# Patient Record
Sex: Female | Born: 1975
Health system: Southern US, Community
[De-identification: ages and names within clinical notes are randomized; demographics above are authoritative.]

## PROBLEM LIST (undated history)

## (undated) ENCOUNTER — Inpatient Hospital Stay (HOSPITAL_COMMUNITY): Payer: Self-pay

## (undated) DIAGNOSIS — J189 Pneumonia, unspecified organism: Secondary | ICD-10-CM

## (undated) DIAGNOSIS — R112 Nausea with vomiting, unspecified: Secondary | ICD-10-CM

## (undated) DIAGNOSIS — N189 Chronic kidney disease, unspecified: Secondary | ICD-10-CM

## (undated) DIAGNOSIS — F419 Anxiety disorder, unspecified: Secondary | ICD-10-CM

## (undated) DIAGNOSIS — C801 Malignant (primary) neoplasm, unspecified: Secondary | ICD-10-CM

## (undated) DIAGNOSIS — E282 Polycystic ovarian syndrome: Secondary | ICD-10-CM

## (undated) DIAGNOSIS — C8590 Non-Hodgkin lymphoma, unspecified, unspecified site: Secondary | ICD-10-CM

## (undated) DIAGNOSIS — Z87442 Personal history of urinary calculi: Secondary | ICD-10-CM

## (undated) DIAGNOSIS — Z9889 Other specified postprocedural states: Secondary | ICD-10-CM

## (undated) HISTORY — PX: TONSILLECTOMY AND ADENOIDECTOMY: SUR1326

## (undated) HISTORY — PX: OTHER SURGICAL HISTORY: SHX169

## (undated) HISTORY — DX: Chronic kidney disease, unspecified: N18.9

## (undated) HISTORY — DX: Pneumonia, unspecified organism: J18.9

## (undated) HISTORY — PX: ROTATOR CUFF REPAIR: SHX139

## (undated) HISTORY — PX: MYRINGOTOMY: SUR874

## (undated) MED FILL — Ceftriaxone Sodium For Inj 2 GM: INTRAMUSCULAR | Qty: 20 | Status: AC

---

## 2003-12-02 ENCOUNTER — Other Ambulatory Visit: Admission: RE | Admit: 2003-12-02 | Discharge: 2003-12-02 | Payer: Self-pay | Admitting: Obstetrics and Gynecology

## 2004-12-12 ENCOUNTER — Other Ambulatory Visit: Admission: RE | Admit: 2004-12-12 | Discharge: 2004-12-12 | Payer: Self-pay | Admitting: Obstetrics and Gynecology

## 2005-12-18 ENCOUNTER — Other Ambulatory Visit: Admission: RE | Admit: 2005-12-18 | Discharge: 2005-12-18 | Payer: Self-pay | Admitting: Obstetrics and Gynecology

## 2007-10-18 ENCOUNTER — Encounter: Admission: RE | Admit: 2007-10-18 | Discharge: 2007-10-18 | Payer: Self-pay | Admitting: Family Medicine

## 2007-10-31 ENCOUNTER — Ambulatory Visit: Payer: Self-pay | Admitting: Cardiology

## 2007-10-31 ENCOUNTER — Ambulatory Visit: Payer: Self-pay | Admitting: Internal Medicine

## 2007-10-31 DIAGNOSIS — R071 Chest pain on breathing: Secondary | ICD-10-CM | POA: Insufficient documentation

## 2007-10-31 DIAGNOSIS — R259 Unspecified abnormal involuntary movements: Secondary | ICD-10-CM | POA: Insufficient documentation

## 2007-10-31 DIAGNOSIS — R05 Cough: Secondary | ICD-10-CM

## 2007-10-31 DIAGNOSIS — R059 Cough, unspecified: Secondary | ICD-10-CM | POA: Insufficient documentation

## 2007-10-31 LAB — CONVERTED CEMR LAB
Basophils Relative: 1 % (ref 0.0–1.0)
CO2: 27 meq/L (ref 19–32)
Chloride: 99 meq/L (ref 96–112)
Eosinophils Relative: 0.6 % (ref 0.0–5.0)
GFR calc Af Amer: 125 mL/min
GFR calc non Af Amer: 103 mL/min
Glucose, Bld: 87 mg/dL (ref 70–99)
MCHC: 33.6 g/dL (ref 30.0–36.0)
Monocytes Absolute: 0.6 10*3/uL (ref 0.2–0.7)
Neutrophils Relative %: 70.3 % (ref 43.0–77.0)
Platelets: 240 10*3/uL (ref 150–400)
Potassium: 3.7 meq/L (ref 3.5–5.1)
RBC: 4.44 M/uL (ref 3.87–5.11)
RDW: 12.8 % (ref 11.5–14.6)
WBC: 10.1 10*3/uL (ref 4.5–10.5)

## 2007-11-01 ENCOUNTER — Ambulatory Visit: Admission: RE | Admit: 2007-11-01 | Discharge: 2007-11-01 | Payer: Self-pay | Admitting: Obstetrics and Gynecology

## 2007-11-01 ENCOUNTER — Encounter: Payer: Self-pay | Admitting: Internal Medicine

## 2007-11-01 ENCOUNTER — Telehealth: Payer: Self-pay | Admitting: Internal Medicine

## 2007-11-18 ENCOUNTER — Ambulatory Visit: Payer: Self-pay | Admitting: Internal Medicine

## 2008-09-22 ENCOUNTER — Ambulatory Visit: Payer: Self-pay | Admitting: Internal Medicine

## 2008-09-22 ENCOUNTER — Ambulatory Visit (HOSPITAL_COMMUNITY): Admission: RE | Admit: 2008-09-22 | Discharge: 2008-09-22 | Payer: Self-pay | Admitting: Internal Medicine

## 2008-09-22 DIAGNOSIS — R091 Pleurisy: Secondary | ICD-10-CM | POA: Insufficient documentation

## 2008-09-29 ENCOUNTER — Telehealth (INDEPENDENT_AMBULATORY_CARE_PROVIDER_SITE_OTHER): Payer: Self-pay | Admitting: *Deleted

## 2008-10-01 ENCOUNTER — Ambulatory Visit: Payer: Self-pay | Admitting: Internal Medicine

## 2008-10-01 DIAGNOSIS — J189 Pneumonia, unspecified organism: Secondary | ICD-10-CM

## 2008-10-13 ENCOUNTER — Ambulatory Visit: Payer: Self-pay | Admitting: Internal Medicine

## 2008-10-13 DIAGNOSIS — J328 Other chronic sinusitis: Secondary | ICD-10-CM | POA: Insufficient documentation

## 2008-10-13 DIAGNOSIS — J019 Acute sinusitis, unspecified: Secondary | ICD-10-CM | POA: Insufficient documentation

## 2008-10-15 ENCOUNTER — Ambulatory Visit: Payer: Self-pay | Admitting: Cardiovascular Disease

## 2008-10-20 ENCOUNTER — Telehealth: Payer: Self-pay | Admitting: Internal Medicine

## 2008-10-21 ENCOUNTER — Telehealth: Payer: Self-pay | Admitting: Internal Medicine

## 2008-11-10 ENCOUNTER — Encounter: Payer: Self-pay | Admitting: Internal Medicine

## 2008-11-16 ENCOUNTER — Encounter: Payer: Self-pay | Admitting: Internal Medicine

## 2009-10-09 HISTORY — PX: DILATION AND CURETTAGE OF UTERUS: SHX78

## 2009-10-11 ENCOUNTER — Encounter: Admission: RE | Admit: 2009-10-11 | Discharge: 2009-10-11 | Payer: Self-pay | Admitting: Obstetrics and Gynecology

## 2009-11-14 ENCOUNTER — Emergency Department (HOSPITAL_COMMUNITY): Admission: EM | Admit: 2009-11-14 | Discharge: 2009-11-14 | Payer: Self-pay | Admitting: Emergency Medicine

## 2010-02-04 ENCOUNTER — Ambulatory Visit (HOSPITAL_COMMUNITY): Admission: RE | Admit: 2010-02-04 | Discharge: 2010-02-04 | Payer: Self-pay | Admitting: Obstetrics and Gynecology

## 2010-02-06 DEATH — deceased

## 2010-06-08 IMAGING — CT CT ABD-PELV W/ CM
2 of 4 series · 17 of 46 positions shown, 19 images · IV contrast (APPLIED)
Comparison: None.

CLINICAL DATA: Abdominal pain

CT ABDOMEN AND PELVIS WITH CONTRAST
TECHNIQUE: Multidetector CT imaging of the abdomen and pelvis was
performed following the standard protocol during bolus
administration of intravenous contrast.
Contrast: 100 ML OMNI

[Series 2: abd/pelv with 5.0 b31f st · axial · 0.62mm/px · z∈[-438,-33]mm · 14 of 89 slices shown, 16 images]
[im 4/89  soft-tissue]
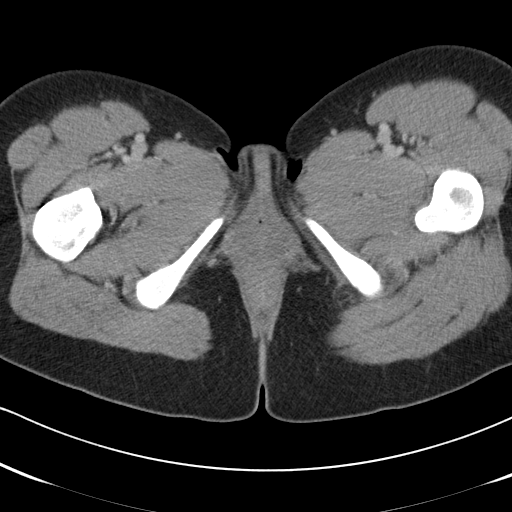
[im 4/89  bone]
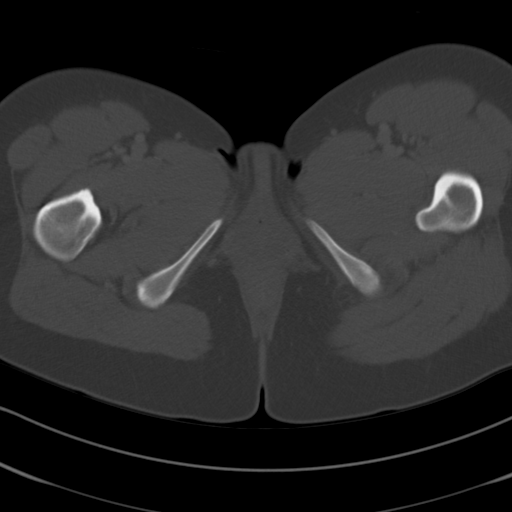
[im 11/89  soft-tissue]
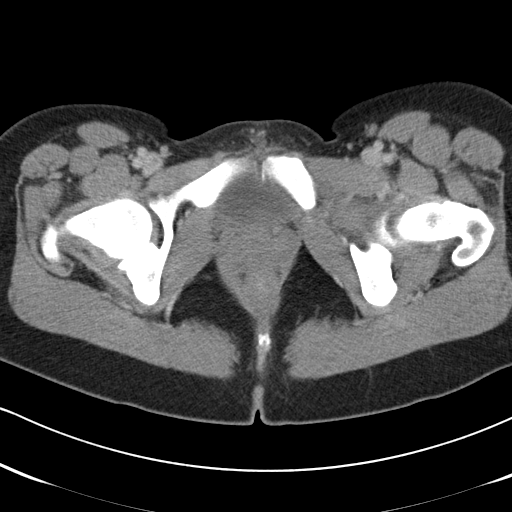
[im 18/89  soft-tissue]
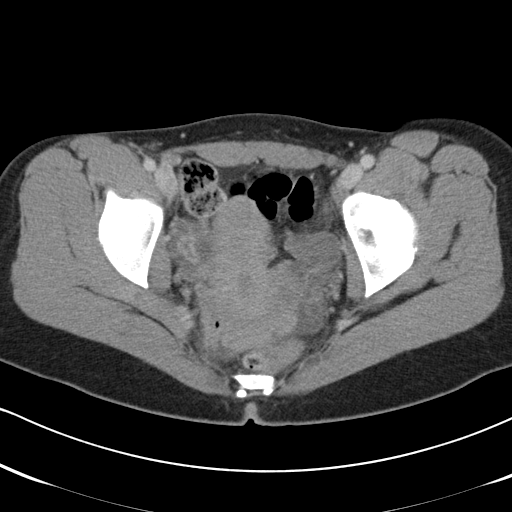
[im 25/89  soft-tissue]
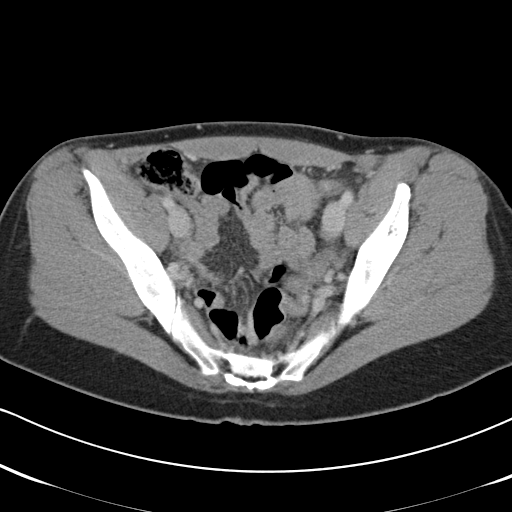
[im 29/89  soft-tissue]
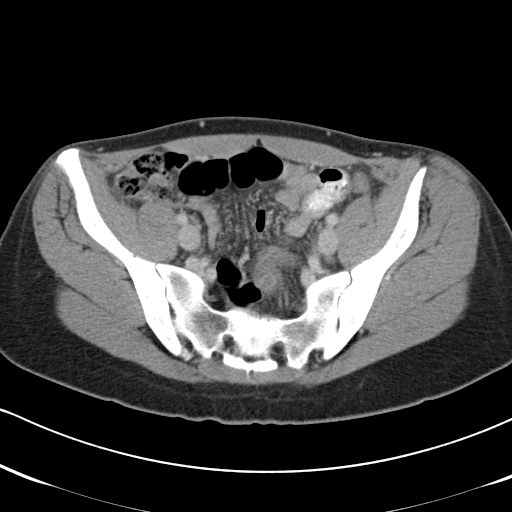
[im 36/89  soft-tissue]
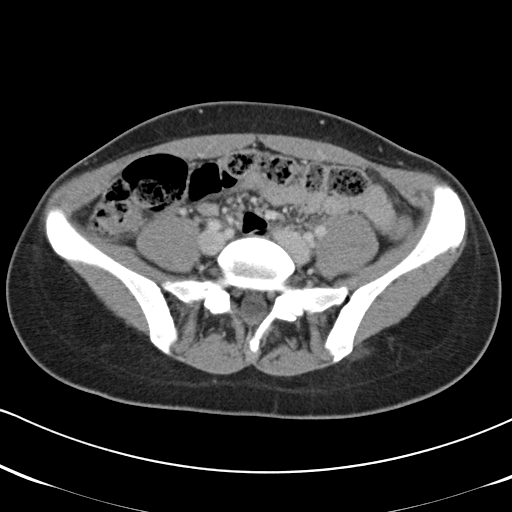
[im 43/89  soft-tissue]
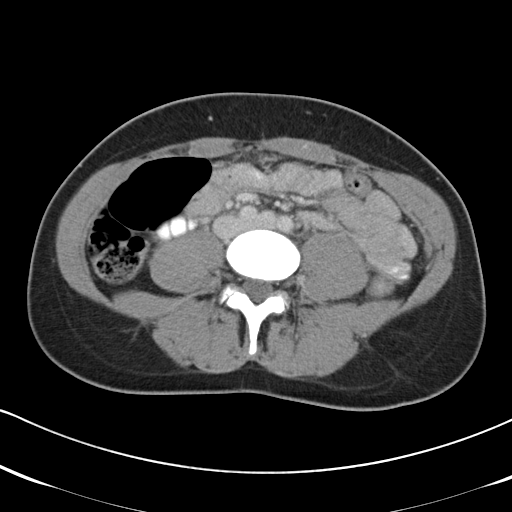
[im 46/89  soft-tissue]
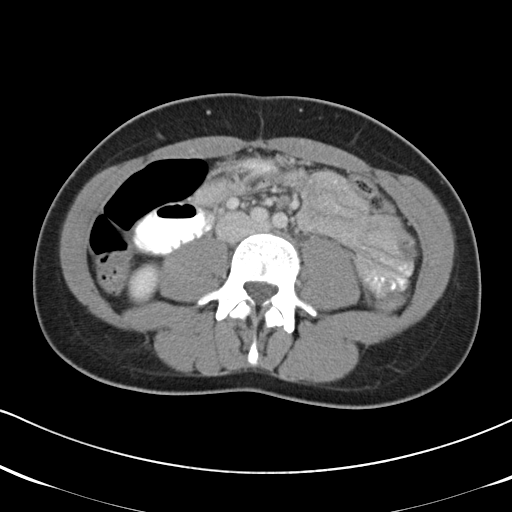
[im 53/89  soft-tissue]
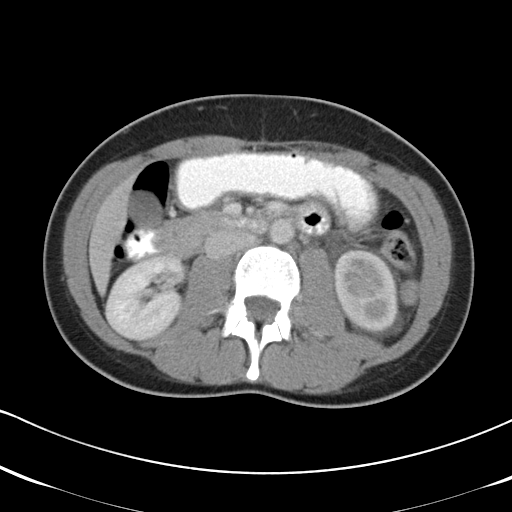
[im 53/89  bone]
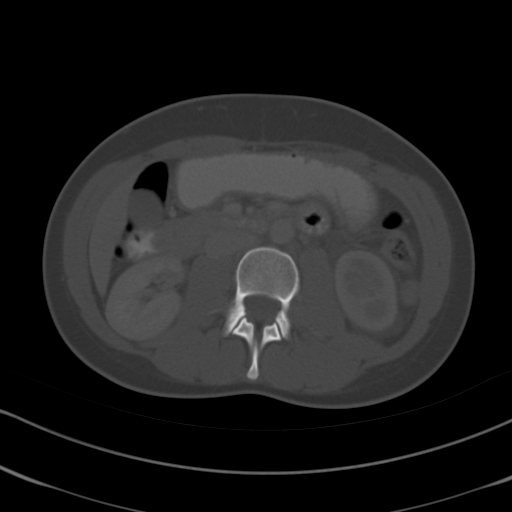
[im 60/89  soft-tissue]
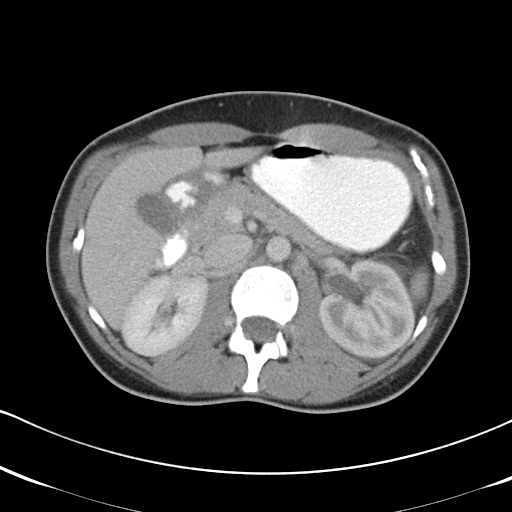
[im 67/89  soft-tissue]
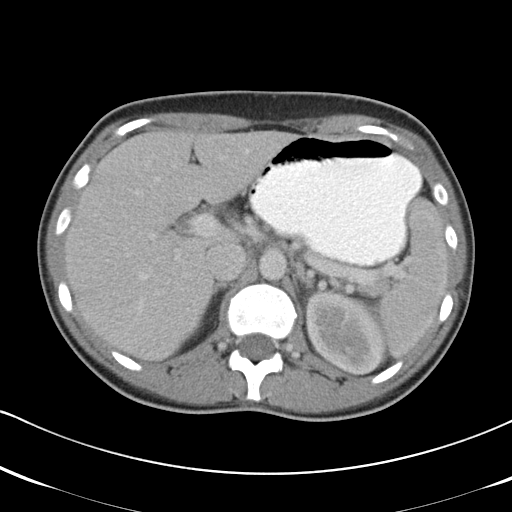
[im 71/89  soft-tissue]
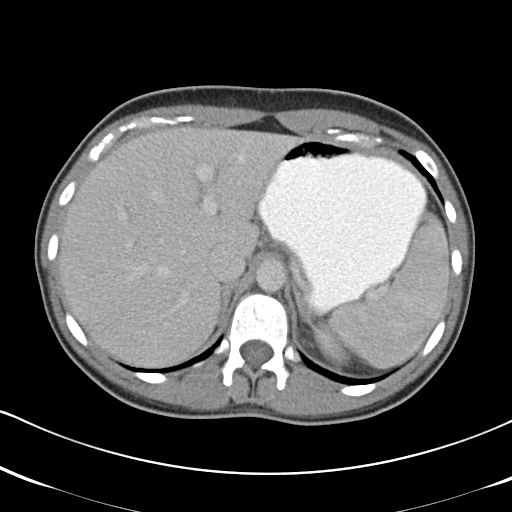
[im 78/89  soft-tissue]
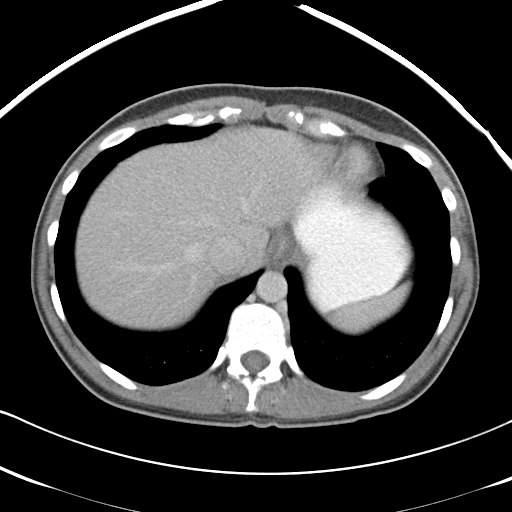
[im 85/89  soft-tissue]
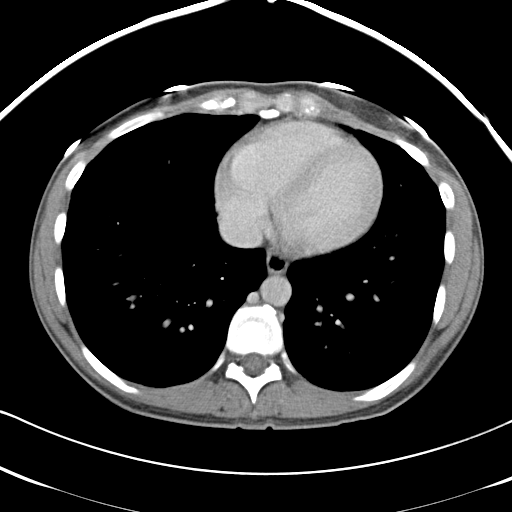

[Series 602: cor · coronal · 0.86mm/px · 3 of 67 slices shown]
[im 23/67  soft-tissue]
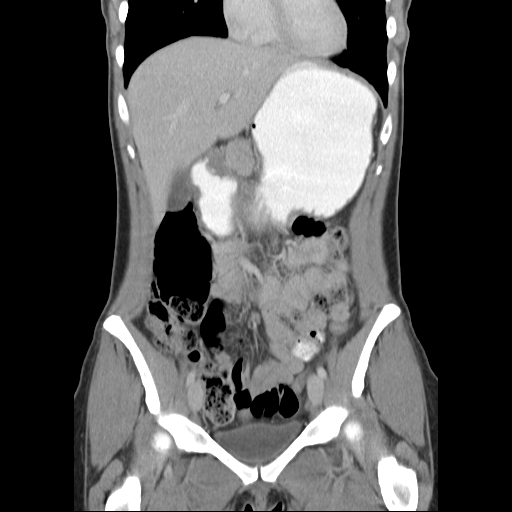
[im 30/67  soft-tissue]
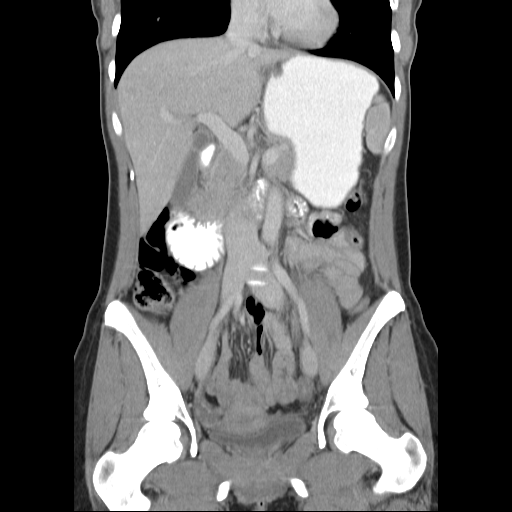
[im 37/67  soft-tissue]
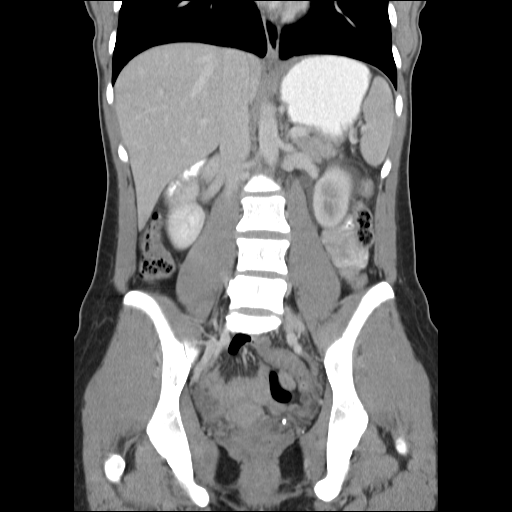

[17 of 46 positions shown; findings below may reference images not displayed]

FINDINGS: Visualized lung bases are clear.

Liver is normal.

Spleen is normal.

Pancreas is normal.

The adrenal glands are normal.

 The right kidney appears normal.

There is a left-sided hydronephrosis, perinephric fat stranding,
and decreased cortical enhancement.  There is left-sided
hydroureter.  Within the distal ureter there is a stone which
measures 3.7 mm.

Gallbladder is unremarkable by CT.

No biliary ductal dilation.

Stomach and visualized large and small bowel are unremarkable.

Abdominal aorta normal is in caliber.

No significant lymphadenopathy.

No free fluid or abnormal fluid collections.

The pelvic bowel loops appear normal.

The uterus and adnexal structures are unremarkable.
IMPRESSION: 1.  Distal left ureteral stone measures 3.7 mm.
2.  Left-sided hydronephrosis.

## 2010-11-06 LAB — CONVERTED CEMR LAB
Neutrophil cytoplasmic antibodies,IgG,serum: <1:20 {titer}
ds DNA Ab: 1 (ref <5)

## 2010-12-27 LAB — CBC
MCHC: 34.5 g/dL (ref 30.0–36.0)
MCV: 93 fL (ref 78.0–100.0)
RBC: 4.17 MIL/uL (ref 3.87–5.11)
WBC: 7.8 10*3/uL (ref 4.0–10.5)

## 2010-12-29 LAB — URINALYSIS, ROUTINE W REFLEX MICROSCOPIC
Bilirubin Urine: NEGATIVE
Glucose, UA: NEGATIVE mg/dL
Nitrite: NEGATIVE
Urobilinogen, UA: 0.2 mg/dL (ref 0.0–1.0)
pH: 7 (ref 5.0–8.0)

## 2010-12-29 LAB — URINE MICROSCOPIC-ADD ON

## 2010-12-29 LAB — CBC
HCT: 41.8 % (ref 36.0–46.0)
Hemoglobin: 14.3 g/dL (ref 12.0–15.0)
MCV: 94.2 fL (ref 78.0–100.0)
Platelets: 169 10*3/uL (ref 150–400)
RBC: 4.44 MIL/uL (ref 3.87–5.11)

## 2010-12-29 LAB — POCT I-STAT, CHEM 8
BUN: 11 mg/dL (ref 6–23)
Calcium, Ion: 1.12 mmol/L (ref 1.12–1.32)
Creatinine, Ser: 1 mg/dL (ref 0.4–1.2)
Glucose, Bld: 141 mg/dL — ABNORMAL HIGH (ref 70–99)
Hemoglobin: 14.6 g/dL (ref 12.0–15.0)
TCO2: 20 mmol/L (ref 0–100)

## 2010-12-29 LAB — DIFFERENTIAL
Basophils Relative: 0 % (ref 0–1)
Eosinophils Absolute: 0 10*3/uL (ref 0.0–0.7)
Eosinophils Relative: 0 % (ref 0–5)
Lymphocytes Relative: 9 % — ABNORMAL LOW (ref 12–46)
Lymphs Abs: 1.2 10*3/uL (ref 0.7–4.0)
Monocytes Absolute: 0.5 10*3/uL (ref 0.1–1.0)
Monocytes Relative: 3 % (ref 3–12)

## 2010-12-29 LAB — WET PREP, GENITAL: Yeast Wet Prep HPF POC: NONE SEEN

## 2010-12-29 LAB — GC/CHLAMYDIA PROBE AMP, GENITAL: Chlamydia, DNA Probe: NEGATIVE

## 2011-09-26 ENCOUNTER — Encounter (HOSPITAL_COMMUNITY): Payer: Self-pay | Admitting: Pharmacist

## 2011-09-26 ENCOUNTER — Encounter (HOSPITAL_COMMUNITY): Payer: Self-pay | Admitting: *Deleted

## 2011-09-26 MED ORDER — CLINDAMYCIN PHOSPHATE 900 MG/50ML IV SOLN
900.0000 mg | INTRAVENOUS | Status: DC
  Filled 2011-09-26: qty 50

## 2011-09-26 MED ORDER — CIPROFLOXACIN IN D5W 400 MG/200ML IV SOLN
400.0000 mg | INTRAVENOUS | Status: DC
  Filled 2011-09-26: qty 200
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
## 2011-09-27 ENCOUNTER — Other Ambulatory Visit: Payer: Self-pay | Admitting: Obstetrics and Gynecology

## 2011-09-27 ENCOUNTER — Encounter (HOSPITAL_COMMUNITY): Payer: Self-pay | Admitting: Anesthesiology

## 2011-09-27 ENCOUNTER — Ambulatory Visit (HOSPITAL_COMMUNITY)
Admission: RE | Admit: 2011-09-27 | Discharge: 2011-09-27 | Disposition: A | Discharge status: Home / Self Care | Payer: BC Managed Care – PPO | Source: Ambulatory Visit | Attending: Obstetrics and Gynecology | Admitting: Obstetrics and Gynecology

## 2011-09-27 ENCOUNTER — Encounter (HOSPITAL_COMMUNITY)
Admission: RE | Disposition: A | Discharge status: Home / Self Care | Payer: Self-pay | Source: Ambulatory Visit | Attending: Obstetrics and Gynecology

## 2011-09-27 ENCOUNTER — Ambulatory Visit (HOSPITAL_COMMUNITY): Payer: BC Managed Care – PPO

## 2011-09-27 ENCOUNTER — Ambulatory Visit (HOSPITAL_COMMUNITY): Payer: BC Managed Care – PPO | Admitting: Anesthesiology

## 2011-09-27 ENCOUNTER — Encounter (HOSPITAL_COMMUNITY): Payer: Self-pay | Admitting: *Deleted

## 2011-09-27 DIAGNOSIS — O021 Missed abortion: Secondary | ICD-10-CM | POA: Insufficient documentation

## 2011-09-27 HISTORY — PX: DILATION AND EVACUATION: SHX1459

## 2011-09-27 LAB — CBC
HCT: 38.6 % (ref 36.0–46.0)
MCH: 30.4 pg (ref 26.0–34.0)
MCHC: 33.2 g/dL (ref 30.0–36.0)
MCV: 91.7 fL (ref 78.0–100.0)
RDW: 12.3 % (ref 11.5–15.5)

## 2011-09-27 LAB — ABO/RH: ABO/RH(D): A POS

## 2011-09-27 SURGERY — DILATION AND EVACUATION, UTERUS
Anesthesia: Choice | Wound class: Clean Contaminated

## 2011-09-27 MED ORDER — LIDOCAINE HCL 1 % IJ SOLN
INTRAMUSCULAR | Status: DC | PRN
  Administered 2011-09-27: 10 mL

## 2011-09-27 MED ORDER — DEXAMETHASONE SODIUM PHOSPHATE 10 MG/ML IJ SOLN
INTRAMUSCULAR | Status: DC | PRN
  Administered 2011-09-27: 10 mg via INTRAVENOUS

## 2011-09-27 MED ORDER — MIDAZOLAM HCL 2 MG/2ML IJ SOLN
INTRAMUSCULAR | Status: AC
  Filled 2011-09-27: qty 2

## 2011-09-27 MED ORDER — LACTATED RINGERS IV SOLN
INTRAVENOUS | Status: DC
  Administered 2011-09-27 (×2): via INTRAVENOUS

## 2011-09-27 MED ORDER — FENTANYL CITRATE 0.05 MG/ML IJ SOLN
INTRAMUSCULAR | Status: DC | PRN
  Administered 2011-09-27 (×2): 50 ug via INTRAVENOUS

## 2011-09-27 MED ORDER — PROPOFOL 10 MG/ML IV EMUL
INTRAVENOUS | Status: AC
  Filled 2011-09-27: qty 20

## 2011-09-27 MED ORDER — CIPROFLOXACIN IN D5W 400 MG/200ML IV SOLN
INTRAVENOUS | Status: DC | PRN
  Administered 2011-09-27: 400 mg via INTRAVENOUS

## 2011-09-27 MED ORDER — CLINDAMYCIN PHOSPHATE 600 MG/50ML IV SOLN
INTRAVENOUS | Status: DC | PRN
  Administered 2011-09-27: 900 mg via INTRAVENOUS

## 2011-09-27 MED ORDER — MIDAZOLAM HCL 5 MG/5ML IJ SOLN
INTRAMUSCULAR | Status: DC | PRN
  Administered 2011-09-27: 2 mg via INTRAVENOUS

## 2011-09-27 MED ORDER — FENTANYL CITRATE 0.05 MG/ML IJ SOLN: 25.0000 ug | INTRAMUSCULAR | Status: DC | PRN

## 2011-09-27 MED ORDER — ONDANSETRON HCL 4 MG/2ML IJ SOLN
INTRAMUSCULAR | Status: DC | PRN
  Administered 2011-09-27: 4 mg via INTRAVENOUS

## 2011-09-27 MED ORDER — KETOROLAC TROMETHAMINE 30 MG/ML IJ SOLN
INTRAMUSCULAR | Status: DC | PRN
  Administered 2011-09-27: 30 mg via INTRAVENOUS

## 2011-09-27 MED ORDER — ONDANSETRON HCL 4 MG/2ML IJ SOLN
INTRAMUSCULAR | Status: AC
  Filled 2011-09-27: qty 2

## 2011-09-27 MED ORDER — LACTATED RINGERS IV SOLN: INTRAVENOUS | Status: DC

## 2011-09-27 MED ORDER — KETOROLAC TROMETHAMINE 60 MG/2ML IM SOLN
INTRAMUSCULAR | Status: AC
  Filled 2011-09-27: qty 2

## 2011-09-27 MED ORDER — FENTANYL CITRATE 0.05 MG/ML IJ SOLN
INTRAMUSCULAR | Status: AC
  Filled 2011-09-27: qty 2

## 2011-09-27 MED ORDER — LIDOCAINE HCL (CARDIAC) 20 MG/ML IV SOLN
INTRAVENOUS | Status: DC | PRN
  Administered 2011-09-27: 100 mg via INTRAVENOUS

## 2011-09-27 MED ORDER — KETOROLAC TROMETHAMINE 30 MG/ML IJ SOLN: 15.0000 mg | Freq: Once | INTRAMUSCULAR | Status: DC | PRN

## 2011-09-27 MED ORDER — PROPOFOL 10 MG/ML IV EMUL
INTRAVENOUS | Status: DC | PRN
  Administered 2011-09-27: 200 mg via INTRAVENOUS

## 2011-09-27 MED ORDER — DEXAMETHASONE SODIUM PHOSPHATE 10 MG/ML IJ SOLN
INTRAMUSCULAR | Status: AC
  Filled 2011-09-27: qty 1

## 2011-09-27 SURGICAL SUPPLY — 18 items
CATH ROBINSON RED A/P 16FR (CATHETERS) ×2 IMPLANT
CLOTH BEACON ORANGE TIMEOUT ST (SAFETY) ×2 IMPLANT
DECANTER SPIKE VIAL GLASS SM (MISCELLANEOUS) ×2 IMPLANT
GLOVE BIO SURGEON STRL SZ 6.5 (GLOVE) ×4 IMPLANT
GOWN PREVENTION PLUS LG XLONG (DISPOSABLE) ×4 IMPLANT
KIT BERKELEY 1ST TRIMESTER 3/8 (MISCELLANEOUS) ×2 IMPLANT
NEEDLE SPNL 22GX3.5 QUINCKE BK (NEEDLE) ×2 IMPLANT
NS IRRIG 1000ML POUR BTL (IV SOLUTION) ×2 IMPLANT
PACK VAGINAL MINOR WOMEN LF (CUSTOM PROCEDURE TRAY) ×2 IMPLANT
PAD PREP 24X48 CUFFED NSTRL (MISCELLANEOUS) ×2 IMPLANT
SET BERKELEY SUCTION TUBING (SUCTIONS) ×2 IMPLANT
SYR CONTROL 10ML LL (SYRINGE) ×2 IMPLANT
TOWEL OR 17X24 6PK STRL BLUE (TOWEL DISPOSABLE) ×4 IMPLANT
VACURETTE 10 RIGID CVD (CANNULA) IMPLANT
VACURETTE 7MM CVD STRL WRAP (CANNULA) IMPLANT
VACURETTE 8 RIGID CVD (CANNULA) IMPLANT
VACURETTE 9 RIGID CVD (CANNULA) IMPLANT
WATER STERILE IRR 1000ML POUR (IV SOLUTION) ×2 IMPLANT

## 2011-09-27 NOTE — Transfer of Care (Signed)
Immediate Anesthesia Transfer of Care Note  Patient: ADAHLIA STEMBRIDGE  Procedure(s) Performed:  DILATATION AND EVACUATION - with Ultrasound Guidance and Chromosomal Studies  Patient Location: PACU  Anesthesia Type: General  Level of Consciousness: awake, alert  and oriented  Airway & Oxygen Therapy: Patient Spontanous Breathing  Post-op Assessment: Report given to PACU RN  Post vital signs: Reviewed and stable  Complications: No apparent anesthesia complications

## 2011-09-27 NOTE — Anesthesia Postprocedure Evaluation (Signed)
  Anesthesia Post-op Note  Patient: Adrienne Morgan  Procedure(s) Performed:  DILATATION AND EVACUATION - with Ultrasound Guidance and Chromosomal Studies  Patient is awake and responsive. Pain and nausea are reasonably well controlled. Vital signs are stable and clinically acceptable. Oxygen saturation is clinically acceptable. There are no apparent anesthetic complications at this time. Patient is ready for discharge.

## 2011-09-27 NOTE — Anesthesia Preprocedure Evaluation (Signed)
Anesthesia Evaluation  Patient identified by MRN, date of birth, ID band Patient awake    Reviewed: Allergy & Precautions, H&P , Patient's Chart, lab work & pertinent test results, reviewed documented beta blocker date and time   Airway Mallampati: II TM Distance: >3 FB Neck ROM: full    Dental No notable dental hx.    Pulmonary  clear to auscultation  Pulmonary exam normal       Cardiovascular regular Normal    Neuro/Psych    GI/Hepatic   Endo/Other    Renal/GU      Musculoskeletal   Abdominal   Peds  Hematology   Anesthesia Other Findings   Reproductive/Obstetrics                           Anesthesia Physical Anesthesia Plan  ASA: II  Anesthesia Plan: General   Post-op Pain Management:    Induction: Intravenous  Airway Management Planned: LMA  Additional Equipment:   Intra-op Plan:   Post-operative Plan:   Informed Consent: I have reviewed the patients History and Physical, chart, labs and discussed the procedure including the risks, benefits and alternatives for the proposed anesthesia with the patient or authorized representative who has indicated his/her understanding and acceptance.   Dental Advisory Given  Plan Discussed with: CRNA and Surgeon  Anesthesia Plan Comments: (  Discussed  general anesthesia, including possible nausea, instrumentation of airway, sore throat,pulmonary aspiration, etc. I asked if the were any outstanding questions, or  concerns before we proceeded. )        Anesthesia Quick Evaluation

## 2011-09-27 NOTE — Progress Notes (Signed)
History and physical on the chart. Patient is having more vaginal bleeding since I last saw her. Will proceed with D and E with ultrasound guidance and chromosone analysis Risks already reviewed with the patient.

## 2011-09-27 NOTE — Brief Op Note (Signed)
09/27/2011  2:04 PM  PATIENT:  Dalbert Mayotte  35 y.o. female  PRE-OPERATIVE DIAGNOSIS:  Missed Abortion  POST-OPERATIVE DIAGNOSIS:  Missed Abortion  PROCEDURE:  Procedure(s): DILATATION AND EVACUATION with Ultrasound Guidance    SURGEON:  Surgeon(s): Jeani Hawking, MD  PHYSICIAN ASSISTANT:   ASSISTANTS: none   ANESTHESIA:   IV sedation and paracervical block  EBL:  Total I/O In: 1000 [I.V.:1000] Out: -   BLOOD ADMINISTERED:none  DRAINS: none   LOCAL MEDICATIONS USED:  LIDOCAINE 10 CC  SPECIMEN:  Source of Specimen:  poc  DISPOSITION OF SPECIMEN:  PATHOLOGY  COUNTS:  YES  TOURNIQUET:  * No tourniquets in log *  DICTATION: .Other Dictation: Dictation Number [id number]  PLAN OF CARE: Discharge to home after PACU  PATIENT DISPOSITION:  PACU - hemodynamically stable.   Delay start of Pharmacological VTE agent (>24hrs) due to surgical blood loss or risk of bleeding:  {YES/NO/NOT APPLICABLE:20182

## 2011-09-28 ENCOUNTER — Encounter (HOSPITAL_COMMUNITY): Payer: Self-pay | Admitting: Obstetrics and Gynecology

## 2011-09-28 NOTE — Op Note (Signed)
NAMEKARREN, NEWLAND NO.:  [id number]  MEDICAL RECORD NO.:  [id number]  LOCATION:  [facility]                          FACILITY:  [facility]  PHYSICIAN:  Charnita Trudel L. Yarah Fuente, M.D.DATE OF BIRTH:  1976-03-25  DATE OF PROCEDURE:  09/27/2011 DATE OF DISCHARGE:  09/27/2011                              OPERATIVE REPORT   PREOPERATIVE DIAGNOSIS:  Missed abortion.  POSTOPERATIVE DIAGNOSIS:  Missed abortion.  PROCEDURE:  D and E with ultrasound guidance and chromosome analysis.  SURGEON:  Sulay Brymer L. Yissel Habermehl, MD.  ANESTHESIA:  Paracervical block with LMA.  ESTIMATED BLOOD LOSS:  Minimal.  COMPLICATIONS:  None.  DRAINS:  None.  PATHOLOGY:  Products of conception.  A portion of this sent for chromosome analysis.  DESCRIPTION OF PROCEDURE:  The patient was taken to the operating room. She was administered anesthesia.  Time-out was performed.  She was prepped and draped.  In and out catheter was used to empty the bladder. A speculum was inserted into the vagina.  The cervix was grasped with a tenaculum.  A paracervical block was performed in standard fashion. Using ultrasound guidance, the cervical internal os was gently dilated using Pratt dilators.  A #7 suction cannula was inserted.  The uterus was thoroughly suctioned of all tissue until the cavity was clean.  A sharp curette was inserted and the uterus was then curetted and uterine walls were cleaned.  Bleeding was minimal.  All instruments were removed from the vagina.  All sponge, lap, and instrument counts were correct x2.  A portion of the tissue will be sent for pathology for chromosome analysis.     Ariel Dimitri L. Vincente Poli, M.D.     Florestine Avers  D:  09/27/2011  T:  09/27/2011  Job:  [id number]

## 2011-10-10 DEATH — deceased

## 2011-10-17 LAB — CHROMOSOME STD, POC(TISSUE)-NCBH

## 2012-02-21 ENCOUNTER — Other Ambulatory Visit (HOSPITAL_COMMUNITY): Payer: Self-pay | Admitting: Obstetrics and Gynecology

## 2012-02-21 DIAGNOSIS — N96 Recurrent pregnancy loss: Secondary | ICD-10-CM

## 2012-02-26 ENCOUNTER — Ambulatory Visit (HOSPITAL_COMMUNITY)
Admission: RE | Admit: 2012-02-26 | Discharge: 2012-02-26 | Disposition: A | Discharge status: Home / Self Care | Payer: BC Managed Care – PPO | Source: Ambulatory Visit | Attending: Obstetrics and Gynecology | Admitting: Obstetrics and Gynecology

## 2012-02-26 DIAGNOSIS — N96 Recurrent pregnancy loss: Secondary | ICD-10-CM | POA: Insufficient documentation

## 2012-02-26 MED ORDER — IOHEXOL 300 MG/ML  SOLN: 5.0000 mL | Freq: Once | INTRAMUSCULAR | Status: AC | PRN

## 2012-10-09 DIAGNOSIS — N189 Chronic kidney disease, unspecified: Secondary | ICD-10-CM

## 2012-10-09 HISTORY — DX: Chronic kidney disease, unspecified: N18.9

## 2012-10-09 NOTE — L&D Delivery Note (Signed)
Delivery Note At 4:43 PM a viable female was delivered via Vaginal, Spontaneous Delivery OA Presentation Apgars 8 and 9 Placenta status: spontaneously with 3 vessel cord .  Cord:  with the following complications: nuchal cord x 1.  Cord pH: not obtained  Anesthesia: Epidural  Episiotomy: none Lacerations: right sidewall Suture Repair: 3.0 chromic Est. Blood Loss (mL): 300  Mom to postpartum.  Baby to NICU.  Adrienne Morgan L 08/25/2013, 4:56 PM

## 2012-10-29 ENCOUNTER — Other Ambulatory Visit: Payer: Self-pay | Admitting: Obstetrics and Gynecology

## 2013-03-11 LAB — OB RESULTS CONSOLE HIV ANTIBODY (ROUTINE TESTING): HIV: NONREACTIVE

## 2013-03-11 LAB — OB RESULTS CONSOLE RPR: RPR: NONREACTIVE

## 2013-03-11 LAB — OB RESULTS CONSOLE ANTIBODY SCREEN: Antibody Screen: NEGATIVE

## 2013-03-11 LAB — OB RESULTS CONSOLE GC/CHLAMYDIA: Chlamydia: NEGATIVE

## 2013-06-14 ENCOUNTER — Inpatient Hospital Stay (HOSPITAL_COMMUNITY)
Admission: EM | Admit: 2013-06-14 | Discharge: 2013-06-15 | Disposition: A | Discharge status: Home / Self Care | Payer: BC Managed Care – PPO | Attending: Obstetrics and Gynecology | Admitting: Obstetrics and Gynecology

## 2013-06-14 ENCOUNTER — Encounter (HOSPITAL_COMMUNITY): Payer: Self-pay | Admitting: Emergency Medicine

## 2013-06-14 DIAGNOSIS — O99891 Other specified diseases and conditions complicating pregnancy: Secondary | ICD-10-CM | POA: Insufficient documentation

## 2013-06-14 DIAGNOSIS — Y92410 Unspecified street and highway as the place of occurrence of the external cause: Secondary | ICD-10-CM | POA: Insufficient documentation

## 2013-06-14 DIAGNOSIS — R109 Unspecified abdominal pain: Secondary | ICD-10-CM | POA: Insufficient documentation

## 2013-06-14 HISTORY — DX: Polycystic ovarian syndrome: E28.2
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
## 2013-06-15 ENCOUNTER — Encounter (HOSPITAL_COMMUNITY): Payer: Self-pay | Admitting: *Deleted

## 2013-06-15 ENCOUNTER — Inpatient Hospital Stay (HOSPITAL_COMMUNITY): Payer: BC Managed Care – PPO

## 2013-06-15 LAB — CBC WITH DIFFERENTIAL/PLATELET
Basophils Absolute: 0 10*3/uL (ref 0.0–0.1)
HCT: 35 % — ABNORMAL LOW (ref 36.0–46.0)
Hemoglobin: 12.1 g/dL (ref 12.0–15.0)
Lymphocytes Relative: 19 % (ref 12–46)
Monocytes Absolute: 1.1 10*3/uL — ABNORMAL HIGH (ref 0.1–1.0)
Monocytes Relative: 8 % (ref 3–12)
Neutro Abs: 10.1 10*3/uL — ABNORMAL HIGH (ref 1.7–7.7)
RBC: 3.87 MIL/uL (ref 3.87–5.11)
WBC: 13.9 10*3/uL — ABNORMAL HIGH (ref 4.0–10.5)

## 2013-06-15 LAB — BASIC METABOLIC PANEL
BUN: 9 mg/dL (ref 6–23)
CO2: 20 mEq/L (ref 19–32)
Chloride: 101 mEq/L (ref 96–112)
Creatinine, Ser: 0.63 mg/dL (ref 0.50–1.10)

## 2013-06-15 LAB — ABO/RH: ABO/RH(D): A POS
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
## 2013-06-15 NOTE — MAU Note (Signed)
Marcelle Overlie, MD notified of patient's FHR tracing. Also notified of marginal insertion noted from [place] and was told that patient has not been notified of this finding.
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
## 2013-06-15 NOTE — Progress Notes (Signed)
[facility] here for transfer. EFM monitors removed.

## 2013-08-14 ENCOUNTER — Other Ambulatory Visit: Payer: Self-pay

## 2013-08-23 ENCOUNTER — Inpatient Hospital Stay (HOSPITAL_COMMUNITY)
Admission: AD | Admit: 2013-08-23 | Discharge: 2013-08-27 | DRG: 775 | Disposition: A | Discharge status: Home / Self Care | Payer: BC Managed Care – PPO | Source: Ambulatory Visit | Attending: Obstetrics & Gynecology | Admitting: Obstetrics & Gynecology

## 2013-08-23 ENCOUNTER — Encounter (HOSPITAL_COMMUNITY): Payer: Self-pay | Admitting: *Deleted

## 2013-08-23 DIAGNOSIS — O26879 Cervical shortening, unspecified trimester: Secondary | ICD-10-CM | POA: Diagnosis present

## 2013-08-23 DIAGNOSIS — O4290 Premature rupture of membranes, unspecified as to length of time between rupture and onset of labor, unspecified weeks of gestation: Principal | ICD-10-CM | POA: Diagnosis present

## 2013-08-23 DIAGNOSIS — O09529 Supervision of elderly multigravida, unspecified trimester: Secondary | ICD-10-CM | POA: Diagnosis present

## 2013-08-23 LAB — URINALYSIS, ROUTINE W REFLEX MICROSCOPIC
Glucose, UA: NEGATIVE mg/dL
Hgb urine dipstick: NEGATIVE
Leukocytes, UA: NEGATIVE
Specific Gravity, Urine: 1.025 (ref 1.005–1.030)
pH: 6.5 (ref 5.0–8.0)

## 2013-08-23 LAB — CBC
HCT: 33.7 % — ABNORMAL LOW (ref 36.0–46.0)
Hemoglobin: 11.3 g/dL — ABNORMAL LOW (ref 12.0–15.0)
RBC: 3.89 MIL/uL (ref 3.87–5.11)
RDW: 13 % (ref 11.5–15.5)

## 2013-08-23 MED ORDER — ACETAMINOPHEN 325 MG PO TABS: 650.0000 mg | ORAL_TABLET | ORAL | Status: DC | PRN

## 2013-08-23 MED ORDER — CALCIUM CARBONATE ANTACID 500 MG PO CHEW
2.0000 | CHEWABLE_TABLET | ORAL | Status: DC | PRN
  Filled 2013-08-23: qty 2

## 2013-08-23 MED ORDER — BETAMETHASONE SOD PHOS & ACET 6 (3-3) MG/ML IJ SUSP
12.0000 mg | INTRAMUSCULAR | Status: AC
  Administered 2013-08-23 – 2013-08-24 (×2): 12 mg via INTRAMUSCULAR
  Filled 2013-08-23 (×2): qty 2

## 2013-08-23 MED ORDER — MAGNESIUM SULFATE BOLUS VIA INFUSION
4.0000 g | Freq: Once | INTRAVENOUS | Status: AC
  Administered 2013-08-23: 4 g via INTRAVENOUS
  Filled 2013-08-23: qty 500

## 2013-08-23 MED ORDER — SODIUM CHLORIDE 0.9 % IV SOLN
2.0000 g | Freq: Four times a day (QID) | INTRAVENOUS | Status: AC
  Administered 2013-08-23 – 2013-08-25 (×8): 2 g via INTRAVENOUS
  Filled 2013-08-23 (×7): qty 2000

## 2013-08-23 MED ORDER — MAGNESIUM SULFATE 40 G IN LACTATED RINGERS - SIMPLE
2.0000 g/h | INTRAVENOUS | Status: DC
  Administered 2013-08-24: 2 g/h via INTRAVENOUS
  Filled 2013-08-23 (×3): qty 500

## 2013-08-23 MED ORDER — ZOLPIDEM TARTRATE 5 MG PO TABS
5.0000 mg | ORAL_TABLET | Freq: Every evening | ORAL | Status: DC | PRN
  Administered 2013-08-24 (×2): 5 mg via ORAL
  Filled 2013-08-23 (×2): qty 1

## 2013-08-23 MED ORDER — AMOXICILLIN 500 MG PO CAPS
500.0000 mg | ORAL_CAPSULE | Freq: Three times a day (TID) | ORAL | Status: DC
  Filled 2013-08-23 (×3): qty 1

## 2013-08-23 MED ORDER — PRENATAL MULTIVITAMIN CH
1.0000 | ORAL_TABLET | Freq: Every day | ORAL | Status: DC
  Administered 2013-08-24 – 2013-08-25 (×2): 1 via ORAL
  Filled 2013-08-23 (×3): qty 1

## 2013-08-23 MED ORDER — AZITHROMYCIN 500 MG PO TABS
500.0000 mg | ORAL_TABLET | Freq: Every day | ORAL | Status: DC
  Filled 2013-08-23 (×2): qty 1

## 2013-08-23 MED ORDER — DEXTROSE 5 % IV SOLN
500.0000 mg | INTRAVENOUS | Status: AC
  Administered 2013-08-23 – 2013-08-24 (×2): 500 mg via INTRAVENOUS
  Filled 2013-08-23 (×2): qty 500

## 2013-08-23 MED ORDER — DOCUSATE SODIUM 100 MG PO CAPS
100.0000 mg | ORAL_CAPSULE | Freq: Every day | ORAL | Status: DC
  Administered 2013-08-23 – 2013-08-25 (×3): 100 mg via ORAL
  Filled 2013-08-23 (×5): qty 1
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
## 2013-08-23 NOTE — MAU Note (Signed)
History of short cervix 1.9 cm.  Had FFN in the office Thursday cervix closed.  Had gush of clear  fluid this afternoon, also noticed mucous plug.

## 2013-08-24 ENCOUNTER — Inpatient Hospital Stay (HOSPITAL_COMMUNITY): Payer: BC Managed Care – PPO

## 2013-08-24 LAB — OB RESULTS CONSOLE GBS: GBS: NEGATIVE

## 2013-08-24 LAB — GROUP B STREP BY PCR: Group B strep by PCR: NEGATIVE

## 2013-08-24 MED ORDER — LACTATED RINGERS IV SOLN
INTRAVENOUS | Status: DC
  Administered 2013-08-23 – 2013-08-25 (×5): via INTRAVENOUS
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
## 2013-08-24 NOTE — Progress Notes (Signed)
On oncoming assessment day shift RN noted that night shift RN mistakenly charted urine output under EBL that totaled 1100 ml. Patient not actively bleeding.

## 2013-08-24 NOTE — H&P (Addendum)
Adrienne Morgan is a 37 y.o. female presenting for rupture of membranes at 8pm last night.  She was confirmed PPROM in MAU with gross rupture and her cervix was visually closed.  She has been following in the office for shortened cervix with last length 1.9 cm.  She is AMA and had negative MaterniT21.  She is having some leakage which is pink and occasional mild contractions.  +FM.  No f/c or abdominal tenderness.    Maternal Medical History:  Reason for admission: Rupture of membranes.   Fetal activity: Perceived fetal activity is normal.   Last perceived fetal movement was within the past hour.    Prenatal complications: no prenatal complications Prenatal Complications - Diabetes: none.    OB History   Grav Para Term Preterm Abortions TAB SAB Ect Mult Living   4    3  3         Past Medical History  Diagnosis Date  . No pertinent past medical history   . Diabetes mellitus without complication   . PCOS (polycystic ovarian syndrome)    Past Surgical History  Procedure Laterality Date  . Tonsillectomy    . Dilation and curettage of uterus  2011    missed  . Dilation and evacuation  09/27/2011    Procedure: DILATATION AND EVACUATION;  Surgeon: Jeani Hawking, MD;  Location: [facility];  Service: Gynecology;  Laterality: N/A;  with Ultrasound Guidance and Chromosomal Studies   Family History: family history includes COPD in her paternal grandfather; Diabetes in her father; Hypertension in her father. Social History:  reports that she has never smoked. She has never used smokeless tobacco. She reports that she does not drink alcohol or use illicit drugs.   Prenatal Transfer Tool  Maternal Diabetes: No Genetic Screening: Normal Maternal Ultrasounds/Referrals: Normal Fetal Ultrasounds or other Referrals:  None Maternal Substance Abuse:  No Significant Maternal Medications:  None Significant Maternal Lab Results:  Lab values include: Group B Strep negative Other Comments:   None  ROS    Blood pressure 124/66, pulse 96, temperature 98.8 F (37.1 C), temperature source Axillary, resp. rate 18, height 5\' 3"  (1.6 m), weight 165 lb 6.4 oz (75.025 kg), last menstrual period 12/31/2012. Maternal Exam:  Uterine Assessment: Contraction strength is mild.  Contraction frequency is rare.   Abdomen: Patient reports no abdominal tenderness. Fundal height is c/w dates.   Estimated fetal weight is 4,8.      FHT Category I (120) Toco with irritability Physical Exam  Constitutional: She is oriented to person, place, and time. She appears well-developed and well-nourished.  GI: Soft. There is no rebound and no guarding.  Neurological: She is alert and oriented to person, place, and time.  Skin: Skin is warm and dry.  Psychiatric: She has a normal mood and affect. Her behavior is normal.    Prenatal labs: ABO, Rh: --/--/A POS (09/07 0040) Antibody:   Rubella:   RPR:    HBsAg:    HIV:    GBS:     Assessment/Plan: 37yo G4P0 at [redacted]w[redacted]d with PPROM -Latency antibiotics -BMZ #2 due tonight -Mag until steroid maturity -NICU consult -Ultrasound pending -Monitor for signs of chorio; deliver if occurs or at 34 weeks   Kataleia Quaranta 08/24/2013, 10:21 AM

## 2013-08-24 NOTE — Progress Notes (Signed)
Patient transported in wheelchair for NICU tour. RN accompanying patient.

## 2013-08-25 ENCOUNTER — Encounter (HOSPITAL_COMMUNITY): Payer: BC Managed Care – PPO | Admitting: Anesthesiology

## 2013-08-25 ENCOUNTER — Inpatient Hospital Stay (HOSPITAL_COMMUNITY): Payer: BC Managed Care – PPO | Admitting: Anesthesiology

## 2013-08-25 ENCOUNTER — Encounter (HOSPITAL_COMMUNITY): Payer: Self-pay | Admitting: *Deleted

## 2013-08-25 LAB — CBC
HCT: 30.8 % — ABNORMAL LOW (ref 36.0–46.0)
Hemoglobin: 10.3 g/dL — ABNORMAL LOW (ref 12.0–15.0)
MCH: 29.2 pg (ref 26.0–34.0)
MCHC: 33.4 g/dL (ref 30.0–36.0)
MCV: 87.3 fL (ref 78.0–100.0)
Platelets: 209 10*3/uL (ref 150–400)
RDW: 13.3 % (ref 11.5–15.5)
WBC: 20.1 10*3/uL — ABNORMAL HIGH (ref 4.0–10.5)

## 2013-08-25 MED ORDER — SODIUM BICARBONATE 8.4 % IV SOLN
INTRAVENOUS | Status: DC | PRN
  Administered 2013-08-25: 5 mL via EPIDURAL

## 2013-08-25 MED ORDER — TETANUS-DIPHTH-ACELL PERTUSSIS 5-2.5-18.5 LF-MCG/0.5 IM SUSP: 0.5000 mL | Freq: Once | INTRAMUSCULAR | Status: DC

## 2013-08-25 MED ORDER — BENZOCAINE-MENTHOL 20-0.5 % EX AERO
1.0000 "application " | INHALATION_SPRAY | CUTANEOUS | Status: DC | PRN
  Administered 2013-08-25: 1 via TOPICAL
  Filled 2013-08-25: qty 56

## 2013-08-25 MED ORDER — IBUPROFEN 600 MG PO TABS: 600.0000 mg | ORAL_TABLET | Freq: Four times a day (QID) | ORAL | Status: DC | PRN

## 2013-08-25 MED ORDER — ACETAMINOPHEN 325 MG PO TABS: 650.0000 mg | ORAL_TABLET | ORAL | Status: DC | PRN

## 2013-08-25 MED ORDER — PHENYLEPHRINE 40 MCG/ML (10ML) SYRINGE FOR IV PUSH (FOR BLOOD PRESSURE SUPPORT)
80.0000 ug | PREFILLED_SYRINGE | INTRAVENOUS | Status: DC | PRN
  Filled 2013-08-25: qty 10
  Filled 2013-08-25: qty 2

## 2013-08-25 MED ORDER — PHENYLEPHRINE 40 MCG/ML (10ML) SYRINGE FOR IV PUSH (FOR BLOOD PRESSURE SUPPORT)
80.0000 ug | PREFILLED_SYRINGE | INTRAVENOUS | Status: DC | PRN
  Filled 2013-08-25: qty 2

## 2013-08-25 MED ORDER — SENNOSIDES-DOCUSATE SODIUM 8.6-50 MG PO TABS
2.0000 | ORAL_TABLET | ORAL | Status: DC
  Administered 2013-08-26: 2 via ORAL
  Filled 2013-08-25 (×2): qty 2

## 2013-08-25 MED ORDER — LACTATED RINGERS IV SOLN: 500.0000 mL | INTRAVENOUS | Status: DC | PRN

## 2013-08-25 MED ORDER — DIPHENHYDRAMINE HCL 25 MG PO CAPS: 25.0000 mg | ORAL_CAPSULE | Freq: Four times a day (QID) | ORAL | Status: DC | PRN

## 2013-08-25 MED ORDER — CITRIC ACID-SODIUM CITRATE 334-500 MG/5ML PO SOLN: 30.0000 mL | ORAL | Status: DC | PRN

## 2013-08-25 MED ORDER — FLEET ENEMA 7-19 GM/118ML RE ENEM: 1.0000 | ENEMA | Freq: Every day | RECTAL | Status: DC | PRN

## 2013-08-25 MED ORDER — BISACODYL 10 MG RE SUPP: 10.0000 mg | Freq: Every day | RECTAL | Status: DC | PRN

## 2013-08-25 MED ORDER — LACTATED RINGERS IV SOLN: INTRAVENOUS | Status: DC

## 2013-08-25 MED ORDER — PRENATAL MULTIVITAMIN CH
1.0000 | ORAL_TABLET | Freq: Every day | ORAL | Status: DC
  Administered 2013-08-26 – 2013-08-27 (×2): 1 via ORAL
  Filled 2013-08-25 (×2): qty 1

## 2013-08-25 MED ORDER — LIDOCAINE HCL (PF) 1 % IJ SOLN
30.0000 mL | INTRAMUSCULAR | Status: DC | PRN
  Administered 2013-08-25: 30 mL via SUBCUTANEOUS
  Filled 2013-08-25 (×2): qty 30

## 2013-08-25 MED ORDER — ONDANSETRON HCL 4 MG/2ML IJ SOLN: 4.0000 mg | INTRAMUSCULAR | Status: DC | PRN

## 2013-08-25 MED ORDER — DIPHENHYDRAMINE HCL 50 MG/ML IJ SOLN: 12.5000 mg | INTRAMUSCULAR | Status: DC | PRN

## 2013-08-25 MED ORDER — OXYCODONE-ACETAMINOPHEN 5-325 MG PO TABS: 1.0000 | ORAL_TABLET | ORAL | Status: DC | PRN

## 2013-08-25 MED ORDER — FENTANYL 2.5 MCG/ML BUPIVACAINE 1/10 % EPIDURAL INFUSION (WH - ANES)
14.0000 mL/h | INTRAMUSCULAR | Status: DC | PRN
  Administered 2013-08-25: 14 mL/h via EPIDURAL
  Filled 2013-08-25: qty 125

## 2013-08-25 MED ORDER — MEDROXYPROGESTERONE ACETATE 150 MG/ML IM SUSP: 150.0000 mg | INTRAMUSCULAR | Status: DC | PRN

## 2013-08-25 MED ORDER — WITCH HAZEL-GLYCERIN EX PADS: 1.0000 "application " | MEDICATED_PAD | CUTANEOUS | Status: DC | PRN

## 2013-08-25 MED ORDER — LANOLIN HYDROUS EX OINT: TOPICAL_OINTMENT | CUTANEOUS | Status: DC | PRN

## 2013-08-25 MED ORDER — SIMETHICONE 80 MG PO CHEW: 80.0000 mg | CHEWABLE_TABLET | ORAL | Status: DC | PRN

## 2013-08-25 MED ORDER — LACTATED RINGERS IV SOLN: 500.0000 mL | Freq: Once | INTRAVENOUS | Status: DC

## 2013-08-25 MED ORDER — IBUPROFEN 600 MG PO TABS
600.0000 mg | ORAL_TABLET | Freq: Four times a day (QID) | ORAL | Status: DC
  Administered 2013-08-25 – 2013-08-27 (×6): 600 mg via ORAL
  Filled 2013-08-25 (×6): qty 1

## 2013-08-25 MED ORDER — OXYTOCIN 40 UNITS IN LACTATED RINGERS INFUSION - SIMPLE MED
62.5000 mL/h | INTRAVENOUS | Status: DC
  Administered 2013-08-25: 62.5 mL/h via INTRAVENOUS
  Filled 2013-08-25: qty 1000

## 2013-08-25 MED ORDER — DIBUCAINE 1 % RE OINT: 1.0000 "application " | TOPICAL_OINTMENT | RECTAL | Status: DC | PRN

## 2013-08-25 MED ORDER — ONDANSETRON HCL 4 MG PO TABS: 4.0000 mg | ORAL_TABLET | ORAL | Status: DC | PRN

## 2013-08-25 MED ORDER — EPHEDRINE 5 MG/ML INJ
10.0000 mg | INTRAVENOUS | Status: DC | PRN
  Filled 2013-08-25: qty 2

## 2013-08-25 MED ORDER — ONDANSETRON HCL 4 MG/2ML IJ SOLN: 4.0000 mg | Freq: Four times a day (QID) | INTRAMUSCULAR | Status: DC | PRN

## 2013-08-25 MED ORDER — EPHEDRINE 5 MG/ML INJ
10.0000 mg | INTRAVENOUS | Status: DC | PRN
  Filled 2013-08-25: qty 4
  Filled 2013-08-25: qty 2

## 2013-08-25 MED ORDER — OXYTOCIN BOLUS FROM INFUSION: 500.0000 mL | INTRAVENOUS | Status: DC

## 2013-08-25 MED ORDER — MEASLES, MUMPS & RUBELLA VAC ~~LOC~~ INJ: 0.5000 mL | INJECTION | Freq: Once | SUBCUTANEOUS | Status: DC

## 2013-08-25 MED ORDER — ZOLPIDEM TARTRATE 5 MG PO TABS
5.0000 mg | ORAL_TABLET | Freq: Every evening | ORAL | Status: DC | PRN
  Administered 2013-08-26: 5 mg via ORAL
  Filled 2013-08-25: qty 1

## 2013-08-25 NOTE — Progress Notes (Signed)
Ur chart review completed.  

## 2013-08-25 NOTE — Progress Notes (Signed)
Patient ID: Adrienne Morgan, female   DOB: 04/15/1976, 37 y.o.   MRN: [id number] [redacted]w[redacted]d  S// still leaking clr AF  O//BP 119/68  Pulse 91  Temp(Src) 98.4 F (36.9 C) (Oral)  Resp 20  Ht 5\' 3"  (1.6 m)  Wt 165 lb 6.4 oz (75.025 kg)  BMI 29.31 kg/m2  LMP 12/31/2012  Stable FHR  A+P// [redacted]w[redacted]d, PPROM, will induce @~ 34 weeks, S/P  BMZ + ABX

## 2013-08-25 NOTE — Anesthesia Procedure Notes (Signed)

## 2013-08-25 NOTE — Lactation Note (Signed)
This note was copied from the chart of Adrienne Morgan. Lactation Consultation Note Initial Visit with mom, baby is 4 hours old and in NICU for gest of [redacted]w[redacted]d.  Mom already pumped for 15 minutes, with pump set up by Women's unit tech, No colostrum expressed with DEBP.  Encouraged mom to pump for stimulation every 3 hours and explained she will not always see colostrum, encouraged to collect any amount to save for the NICU.  Demonstrated hand expression with colostrum collected, a few mls, mom to take to NICU at next visit.  Encouraged breast massage prior to pump and hand expression post pumping.  Explained premmie setting, set up and cleaning. More teaching to be done by NICU LC, but mom was eager to get started pumping and to talk to someone from lactation tonight. Mom to call with concerns or needing for assistance.  Encouraged skin to skin in NICU as care allows. Mom was giving NICU booklet by [facility] unit RN encouraged mom to read it.       Patient Name: Adrienne Selin Eisler [id number] Date: 08/25/2013 Reason for consult: Initial assessment;NICU baby;Infant < 6lbs   Maternal Data Has patient been taught Hand Expression?: Yes Does the patient have breastfeeding experience prior to this delivery?: No  Feeding    LATCH Score/Interventions                      Lactation Tools Discussed/Used Pump Review: Setup, frequency, and cleaning;Milk Storage Initiated by:: Franz Dell RN and Women's Unit tech Date initiated:: 08/25/13   Consult Status Consult Status: Follow-up Date: 08/26/13 Follow-up type: In-patient    Jannifer Rodney 08/25/2013, 8:58 PM

## 2013-08-25 NOTE — Progress Notes (Signed)
Moved to L and D after incr ctx and rectal pressure>>exam showed cx 4-5/C/ zero, ready for epidural

## 2013-08-25 NOTE — Anesthesia Preprocedure Evaluation (Addendum)

## 2013-08-26 LAB — CBC
HCT: 28.3 % — ABNORMAL LOW (ref 36.0–46.0)
MCH: 29.4 pg (ref 26.0–34.0)
MCV: 88.4 fL (ref 78.0–100.0)
RBC: 3.2 MIL/uL — ABNORMAL LOW (ref 3.87–5.11)
RDW: 13.4 % (ref 11.5–15.5)
WBC: 16.4 10*3/uL — ABNORMAL HIGH (ref 4.0–10.5)

## 2013-08-26 NOTE — Anesthesia Postprocedure Evaluation (Deleted)
  Anesthesia Post-op Note  Patient: Adrienne Morgan  Procedure(s) Performed: * No procedures listed *  Patient Location: Women's unit  Anesthesia Type:General  Level of Consciousness: awake, alert  and oriented  Airway and Oxygen Therapy: Patient Spontanous Breathing and Patient connected to nasal cannula oxygen  Post-op Pain: none  Post-op Assessment: Post-op Vital signs reviewed and Patient's Cardiovascular Status Stable  Post-op Vital Signs: Reviewed and stable  Complications: No apparent anesthesia complications

## 2013-08-26 NOTE — Lactation Note (Signed)
This note was copied from the chart of Adrienne Morgan. Lactation Consultation Note   Initial consult with this mom of a NICU baby, now 19 hours post partum, and 34 weeks  corrected gestation,. NICU booklet on EBM reviewed with mom.Hand expression reviewed, and mom demonstrated with good technique. Skin to skin encouraged. Mom is very excited to provide milk for this baby.Mom has  A DEP PIS for home use, which she knows how to use.   Patient Name: Adrienne Morgan [id number] Date: 08/26/2013 Reason for consult: Initial assessment;NICU baby   Maternal Data Formula Feeding for Exclusion: Yes (baby in the nicu) Infant to breast within first hour of birth: No Breastfeeding delayed due to:: Infant status Has patient been taught Hand Expression?: Yes Does the patient have breastfeeding experience prior to this delivery?: No  Feeding    LATCH Score/Interventions                      Lactation Tools Discussed/Used Tools: Pump Breast pump type: Double-Electric Breast Pump WIC Program: No Pump Review: Setup, frequency, and cleaning;Milk Storage;Other (comment) (hand expressiion, nicu booklet  on ebm reviewed) Initiated by:: beside rn within 6 hours of delivery Date initiated:: 08/26/13   Consult Status Consult Status: Follow-up Date: 08/27/13 Follow-up type: In-patient    Alfred Levins 08/26/2013, 11:44 AM

## 2013-08-26 NOTE — Progress Notes (Signed)
Post Partum Day 1 Subjective: no complaints, up ad lib, voiding, tolerating PO and baby stable in NICU  Objective: Blood pressure 92/56, pulse 78, temperature 98.5 F (36.9 C), temperature source Oral, resp. rate 16, height 5\' 3"  (1.6 m), weight 165 lb 6.4 oz (75.025 kg), last menstrual period 12/31/2012, SpO2 96.00%, unknown if currently breastfeeding.  Physical Exam:  General: alert and cooperative Lochia: appropriate Uterine Fundus: firm Incision: perineum intact DVT Evaluation: No evidence of DVT seen on physical exam. Negative Homan's sign. No cords or calf tenderness. No significant calf/ankle edema.   Recent Labs  08/25/13 1219 08/26/13 0525  HGB 10.3* 9.4*  HCT 30.8* 28.3*    Assessment/Plan: Plan for discharge tomorrow   LOS: 3 days   Adrienne Morgan G 08/26/2013, 8:34 AM

## 2013-08-26 NOTE — Anesthesia Postprocedure Evaluation (Signed)
  Anesthesia Post-op Note  Patient: Adrienne Morgan  Procedure(s) Performed: * No procedures listed *  Patient Location: PACU and Mother/Baby  Anesthesia Type:Epidural  Level of Consciousness: awake, alert  and oriented  Airway and Oxygen Therapy: Patient Spontanous Breathing  Post-op Pain: none  Post-op Assessment: Post-op Vital signs reviewed, Patient's Cardiovascular Status Stable, No headache, No backache, No residual numbness and No residual motor weakness  Post-op Vital Signs: Reviewed and stable  Complications: No apparent anesthesia complications

## 2013-08-27 ENCOUNTER — Inpatient Hospital Stay (HOSPITAL_COMMUNITY): Admit: 2013-08-27 | Payer: BC Managed Care – PPO

## 2013-08-27 MED ORDER — IBUPROFEN 600 MG PO TABS: 600.0000 mg | ORAL_TABLET | Freq: Four times a day (QID) | ORAL | Status: DC

## 2013-08-27 MED ORDER — SERTRALINE HCL 25 MG PO TABS
25.0000 mg | ORAL_TABLET | Freq: Every day | ORAL | Status: DC
  Filled 2013-08-27 (×2): qty 1

## 2013-08-27 MED ORDER — SERTRALINE HCL 25 MG PO TABS: 25.0000 mg | ORAL_TABLET | Freq: Every day | ORAL | Status: DC

## 2013-08-27 MED ORDER — OXYCODONE-ACETAMINOPHEN 5-325 MG PO TABS: 1.0000 | ORAL_TABLET | ORAL | Status: DC | PRN

## 2013-08-27 NOTE — Progress Notes (Signed)
Discharge instructions reviewed with patient and significant other.  Patient states understanding of home care, medications, activity, signs/symptoms to report to MD and return MD office visit.  No home equipment needed.  Patient ambulated for discharge in stable condition with staff without incident. 

## 2013-08-27 NOTE — Progress Notes (Signed)
CSW attempted to meet with MOB to introduce myself and complete assessment due to NICU admission, but she was not in her room at this time.  CSW will attempt again at a later time, however, CSW is aware that MOB is discharging at some point today.

## 2013-08-27 NOTE — Discharge Summary (Signed)
Obstetric Discharge Summary Reason for Admission: rupture of membranes Prenatal Procedures: ultrasound and Materni T 21 Intrapartum Procedures: spontaneous vaginal delivery Postpartum Procedures: none Complications-Operative and Postpartum: vaginal laceration Hemoglobin  Date Value Range Status  08/26/2013 9.4* 12.0 - 15.0 g/dL Final     HCT  Date Value Range Status  08/26/2013 28.3* 36.0 - 46.0 % Final    Physical Exam:  General: alert and cooperative Lochia: appropriate Uterine Fundus: firm Incision: perineum intact DVT Evaluation: No evidence of DVT seen on physical exam. Negative Homan's sign. No cords or calf tenderness. No significant calf/ankle edema.  Discharge Diagnoses: PPROM and s/p vag delivery  Discharge Information: Date: 08/27/2013 Activity: pelvic rest Diet: routine Medications: PNV, Ibuprofen, Percocet and zoloft Condition: stable Instructions: refer to practice specific booklet Discharge to: home   Newborn Data: Live born female  Birth Weight: 4 lb 12.4 oz (2165 g) APGAR: 8, 9  Home with NICU.  Cortney Beissel G 08/27/2013, 8:40 AM

## 2013-08-27 NOTE — Lactation Note (Signed)
This note was copied from the chart of Adrienne Euline Kimbler. Lactation Consultation Note     Follow up consult with this mom of a NICU baby, now 44 hours post partum, and 34 1/[redacted] weeks gestation. i assisted mom with latching baby for the first time, during an ng feed. He was awake and crying prior to being held by mom, and then he was asleep as soon as he was in mom's arms. i explained to mom this was normal for a baby his gestation, and to just enjoy holding him, whie he is fed by gavage. Mom very pleased. Dad present and very supportive.  Mom is being discharged to home today, and has a DEP at home. i will follow this family in the NICU.  Patient Name: Adrienne Morgan [id number] Date: 08/27/2013 Reason for consult: Follow-up assessment;NICU baby   Maternal Data    Feeding Feeding Type: Breast Milk Length of feed: 30 min  LATCH Score/Interventions Latch: Too sleepy or reluctant, no latch achieved, no sucking elicited. (first time at breast to nuzzle - asleep once skin to skin with mom. ) Intervention(s): Skin to skin;Teach feeding cues;Waking techniques  Audible Swallowing: None Intervention(s): Skin to skin;Hand expression  Type of Nipple: Everted at rest and after stimulation  Comfort (Breast/Nipple): Soft / non-tender     Hold (Positioning): Assistance needed to correctly position infant at breast and maintain latch. Intervention(s): Breastfeeding basics reviewed;Support Pillows;Position options;Skin to skin  LATCH Score: 5  Lactation Tools Discussed/Used     Consult Status Consult Status: Follow-up Follow-up type:  (prn in NICU)    Alfred Levins 08/27/2013, 12:54 PM

## 2013-08-27 NOTE — Progress Notes (Signed)
Ur chart review completed.  

## 2013-08-29 ENCOUNTER — Ambulatory Visit: Payer: Self-pay

## 2013-08-29 NOTE — Lactation Note (Signed)
This note was copied from the chart of Boy Akeelah Seppala. Lactation Consultation Note   Follow up consult with this mom of a NICU baby, just 94 hours post partum. On exam, her breast are full, firm and tender. I gave mom warmer pads to aply for 15 minutes prior to pumping, advised mom to have dad help with massage during pumping, and ice for after. I saw mom an hour later, she was able to express 60 mls total, and was now soft and feeling much better. Mom's milk had just come in, and was more over full than engorged. I will follow this family in the NICU.  Patient Name: Boy Margrett Kalb [id number] Date: 08/29/2013 Reason for consult: Follow-up assessment;NICU baby   Maternal Data    Feeding    LATCH Score/Interventions             Problem noted: Filling;Mild/Moderate discomfort Interventions (Filling): Massage;Double electric pump (heat prio to pumping, ice after, massage with pumping)        Lactation Tools Discussed/Used     Consult Status Consult Status: Follow-up Follow-up type:  (prn in NICU)    Alfred Levins 08/29/2013, 4:37 PM

## 2013-09-09 ENCOUNTER — Ambulatory Visit: Payer: Self-pay

## 2013-09-09 NOTE — Lactation Note (Signed)
This note was copied from the chart of Adrienne Kambre Messner. Lactation Consultation Note    Brief follow up consult with this mom of a NICU baby, now 36 weeks corrected gestation, and breast feeding at times. I told mom to let me know when she wants me to do a pre and post weight on her baby, to see what he can  Transfer. I will follow this mom and bay in the NICU.  Patient Name: Adrienne Morgan [id number] Date: 09/09/2013 Reason for consult: Follow-up assessment;NICU baby   Maternal Data    Feeding Feeding Type: Bottle Fed - Breast Milk Nipple Type: Slow - flow Length of feed: 15 min  LATCH Score/Interventions Latch: Grasps breast easily, tongue down, lips flanged, rhythmical sucking.  Audible Swallowing: Spontaneous and intermittent Intervention(s): Skin to skin  Type of Nipple: Everted at rest and after stimulation  Comfort (Breast/Nipple): Soft / non-tender     Hold (Positioning): No assistance needed to correctly position infant at breast. Intervention(s): Support Pillows  LATCH Score: 10  Lactation Tools Discussed/Used     Consult Status Consult Status: Follow-up Date: 09/10/13 Follow-up type: In-patient    Alfred Levins 09/09/2013, 5:47 PM

## 2013-10-16 ENCOUNTER — Other Ambulatory Visit: Payer: Self-pay | Admitting: Obstetrics and Gynecology

## 2014-08-10 ENCOUNTER — Encounter (HOSPITAL_COMMUNITY): Payer: Self-pay | Admitting: *Deleted

## 2014-11-10 ENCOUNTER — Other Ambulatory Visit: Payer: Self-pay | Admitting: Obstetrics and Gynecology

## 2014-11-11 LAB — CYTOLOGY - PAP

## 2016-02-11 DIAGNOSIS — M50122 Cervical disc disorder at C5-C6 level with radiculopathy: Secondary | ICD-10-CM | POA: Diagnosis not present

## 2016-02-11 DIAGNOSIS — M5020 Other cervical disc displacement, unspecified cervical region: Secondary | ICD-10-CM | POA: Diagnosis not present

## 2016-03-01 DIAGNOSIS — M50222 Other cervical disc displacement at C5-C6 level: Secondary | ICD-10-CM | POA: Diagnosis not present

## 2016-03-14 DIAGNOSIS — Z6821 Body mass index (BMI) 21.0-21.9, adult: Secondary | ICD-10-CM | POA: Diagnosis not present

## 2016-03-14 DIAGNOSIS — R05 Cough: Secondary | ICD-10-CM | POA: Diagnosis not present

## 2016-03-14 DIAGNOSIS — J019 Acute sinusitis, unspecified: Secondary | ICD-10-CM | POA: Diagnosis not present

## 2016-04-20 DIAGNOSIS — M50222 Other cervical disc displacement at C5-C6 level: Secondary | ICD-10-CM | POA: Diagnosis not present

## 2016-04-28 DIAGNOSIS — J209 Acute bronchitis, unspecified: Secondary | ICD-10-CM | POA: Diagnosis not present

## 2016-04-28 DIAGNOSIS — R918 Other nonspecific abnormal finding of lung field: Secondary | ICD-10-CM | POA: Diagnosis not present

## 2016-05-10 DIAGNOSIS — H6983 Other specified disorders of Eustachian tube, bilateral: Secondary | ICD-10-CM | POA: Diagnosis not present

## 2016-05-10 DIAGNOSIS — J0141 Acute recurrent pansinusitis: Secondary | ICD-10-CM | POA: Diagnosis not present

## 2016-05-10 DIAGNOSIS — J302 Other seasonal allergic rhinitis: Secondary | ICD-10-CM | POA: Diagnosis not present

## 2016-08-30 DIAGNOSIS — Z0000 Encounter for general adult medical examination without abnormal findings: Secondary | ICD-10-CM | POA: Diagnosis not present

## 2016-09-04 DIAGNOSIS — E282 Polycystic ovarian syndrome: Secondary | ICD-10-CM | POA: Diagnosis not present

## 2016-09-04 DIAGNOSIS — Z0000 Encounter for general adult medical examination without abnormal findings: Secondary | ICD-10-CM | POA: Diagnosis not present

## 2016-09-04 DIAGNOSIS — Z6822 Body mass index (BMI) 22.0-22.9, adult: Secondary | ICD-10-CM | POA: Diagnosis not present

## 2016-09-04 DIAGNOSIS — E010 Iodine-deficiency related diffuse (endemic) goiter: Secondary | ICD-10-CM | POA: Diagnosis not present

## 2016-09-04 DIAGNOSIS — R918 Other nonspecific abnormal finding of lung field: Secondary | ICD-10-CM | POA: Diagnosis not present

## 2016-09-04 DIAGNOSIS — J208 Acute bronchitis due to other specified organisms: Secondary | ICD-10-CM | POA: Diagnosis not present

## 2016-12-18 DIAGNOSIS — Z1231 Encounter for screening mammogram for malignant neoplasm of breast: Secondary | ICD-10-CM | POA: Diagnosis not present

## 2016-12-25 DIAGNOSIS — Z6822 Body mass index (BMI) 22.0-22.9, adult: Secondary | ICD-10-CM | POA: Diagnosis not present

## 2016-12-25 DIAGNOSIS — Z01419 Encounter for gynecological examination (general) (routine) without abnormal findings: Secondary | ICD-10-CM | POA: Diagnosis not present

## 2017-09-13 DIAGNOSIS — Z0000 Encounter for general adult medical examination without abnormal findings: Secondary | ICD-10-CM | POA: Diagnosis not present

## 2017-09-21 ENCOUNTER — Encounter: Payer: Self-pay | Admitting: Internal Medicine

## 2017-09-21 DIAGNOSIS — J209 Acute bronchitis, unspecified: Secondary | ICD-10-CM | POA: Diagnosis not present

## 2017-09-21 DIAGNOSIS — F39 Unspecified mood [affective] disorder: Secondary | ICD-10-CM | POA: Diagnosis not present

## 2017-09-21 DIAGNOSIS — Z1389 Encounter for screening for other disorder: Secondary | ICD-10-CM | POA: Diagnosis not present

## 2017-09-21 DIAGNOSIS — E010 Iodine-deficiency related diffuse (endemic) goiter: Secondary | ICD-10-CM | POA: Diagnosis not present

## 2017-09-21 DIAGNOSIS — E282 Polycystic ovarian syndrome: Secondary | ICD-10-CM | POA: Diagnosis not present

## 2017-09-21 DIAGNOSIS — Z0000 Encounter for general adult medical examination without abnormal findings: Secondary | ICD-10-CM | POA: Diagnosis not present

## 2017-10-22 DIAGNOSIS — N912 Amenorrhea, unspecified: Secondary | ICD-10-CM | POA: Diagnosis not present

## 2017-10-24 DIAGNOSIS — N912 Amenorrhea, unspecified: Secondary | ICD-10-CM | POA: Diagnosis not present

## 2017-11-02 DIAGNOSIS — N911 Secondary amenorrhea: Secondary | ICD-10-CM | POA: Diagnosis not present

## 2017-11-08 DIAGNOSIS — N911 Secondary amenorrhea: Secondary | ICD-10-CM | POA: Diagnosis not present

## 2017-11-15 DIAGNOSIS — N911 Secondary amenorrhea: Secondary | ICD-10-CM | POA: Diagnosis not present

## 2017-11-19 ENCOUNTER — Ambulatory Visit: Payer: Self-pay | Admitting: Internal Medicine

## 2017-11-20 DIAGNOSIS — Z3481 Encounter for supervision of other normal pregnancy, first trimester: Secondary | ICD-10-CM | POA: Diagnosis not present

## 2017-11-20 DIAGNOSIS — Z3685 Encounter for antenatal screening for Streptococcus B: Secondary | ICD-10-CM | POA: Diagnosis not present

## 2017-11-20 DIAGNOSIS — Z3480 Encounter for supervision of other normal pregnancy, unspecified trimester: Secondary | ICD-10-CM | POA: Diagnosis not present

## 2017-12-10 ENCOUNTER — Other Ambulatory Visit: Payer: Self-pay

## 2017-12-10 ENCOUNTER — Encounter (HOSPITAL_COMMUNITY): Payer: Self-pay | Admitting: *Deleted

## 2017-12-10 DIAGNOSIS — Z3480 Encounter for supervision of other normal pregnancy, unspecified trimester: Secondary | ICD-10-CM | POA: Diagnosis not present

## 2017-12-10 DIAGNOSIS — O021 Missed abortion: Secondary | ICD-10-CM | POA: Diagnosis not present

## 2017-12-10 DIAGNOSIS — Z3A11 11 weeks gestation of pregnancy: Secondary | ICD-10-CM | POA: Diagnosis not present

## 2017-12-10 DIAGNOSIS — Z3400 Encounter for supervision of normal first pregnancy, unspecified trimester: Secondary | ICD-10-CM | POA: Diagnosis not present

## 2017-12-10 DIAGNOSIS — Z113 Encounter for screening for infections with a predominantly sexual mode of transmission: Secondary | ICD-10-CM | POA: Diagnosis not present

## 2017-12-12 NOTE — H&P (Signed)
42 year old G 5 P 1031 at 11 weeks and 1 day presents with Missed Abortion at 8 weeks and 4 days Past Medical History:  Diagnosis Date  . Anxiety    No meds  . History of kidney stones    passed stone - no surgery required  . No pertinent past medical history   . PCOS (polycystic ovarian syndrome)   . PONV (postoperative nausea and vomiting)   . SVD (spontaneous vaginal delivery)    x 1   Past Surgical History:  Procedure Laterality Date  . cervical disk      C5-C6 replacement  . DILATION AND CURETTAGE OF UTERUS  2011   missed  . DILATION AND EVACUATION  09/27/2011   Procedure: DILATATION AND EVACUATION;  Surgeon: Cyril Mourning, MD;  Location: [facility];  Service: Gynecology;  Laterality: N/A;  with Ultrasound Guidance and Chromosomal Studies  . SHOULDER SURGERY     arthroscopy  . TONSILLECTOMY     Prior to Admission medications   Medication Sig Start Date End Date Taking? Authorizing Provider  Prenatal Vit-Fe Fumarate-FA (PRENATAL MULTIVITAMIN) TABS Take 1 tablet by mouth daily.    Yes [provider]  ibuprofen (ADVIL,MOTRIN) 600 MG tablet Take 1 tablet (600 mg total) by mouth every 6 (six) hours. Patient not taking: Reported on 12/10/2017 08/27/13   Juanda Chance, NP  oxyCODONE-acetaminophen (PERCOCET/ROXICET) 5-325 MG per tablet Take 1-2 tablets by mouth every 4 (four) hours as needed for severe pain (moderate - severe pain). Patient not taking: Reported on 12/10/2017 08/27/13   Juanda Chance, NP  sertraline (ZOLOFT) 25 MG tablet Take 1 tablet (25 mg total) by mouth daily. Patient not taking: Reported on 12/10/2017 08/27/13   Juanda Chance, NP   Augmentin [amoxicillin-pot clavulanate]  VSS General alert and oriented Lung CTAB Car RRR Abdomen is soft and non tender  IMPRESSION: Missed Abortion  PLAN: D and E With Ultrasound guidance

## 2017-12-12 NOTE — Anesthesia Preprocedure Evaluation (Addendum)
Anesthesia Evaluation  Patient identified by MRN, date of birth, ID band Patient awake    Reviewed: Allergy & Precautions, H&P , NPO status , Patient's Chart, lab work & pertinent test results  History of Anesthesia Complications (+) PONV  Airway Mallampati: I  TM Distance: >3 FB Neck ROM: full    Dental  (+) Teeth Intact   Pulmonary neg pulmonary ROS,    Pulmonary exam normal breath sounds clear to auscultation       Cardiovascular negative cardio ROS Normal cardiovascular exam Rhythm:Regular Rate:Normal     Neuro/Psych Anxiety negative neurological ROS     GI/Hepatic negative GI ROS, Neg liver ROS,   Endo/Other  negative endocrine ROS  Renal/GU Hx nephrolithiasis  negative genitourinary   Musculoskeletal negative musculoskeletal ROS (+)   Abdominal   Peds  Hematology negative hematology ROS (+)   Anesthesia Other Findings       Reproductive/Obstetrics PCOS; Missed abortion at ~ 8 weeks                            Anesthesia Physical  Anesthesia Plan  ASA: II  Anesthesia Plan: MAC   Post-op Pain Management:    Induction: Intravenous  PONV Risk Score and Plan: Propofol infusion and Treatment may vary due to age or medical condition  Airway Management Planned: Simple Face Mask and Natural Airway  Additional Equipment: None  Intra-op Plan:   Post-operative Plan:   Informed Consent: I have reviewed the patients History and Physical, chart, labs and discussed the procedure including the risks, benefits and alternatives for the proposed anesthesia with the patient or authorized representative who has indicated his/her understanding and acceptance.     Plan Discussed with: CRNA  Anesthesia Plan Comments: ( )        Anesthesia Quick Evaluation

## 2017-12-13 ENCOUNTER — Ambulatory Visit (HOSPITAL_COMMUNITY)
Admission: AD | Admit: 2017-12-13 | Discharge: 2017-12-13 | Disposition: A | Discharge status: Home / Self Care | Payer: BLUE CROSS/BLUE SHIELD | Source: Ambulatory Visit | Attending: Obstetrics and Gynecology | Admitting: Obstetrics and Gynecology

## 2017-12-13 ENCOUNTER — Ambulatory Visit (HOSPITAL_COMMUNITY): Payer: BLUE CROSS/BLUE SHIELD

## 2017-12-13 ENCOUNTER — Other Ambulatory Visit: Payer: Self-pay

## 2017-12-13 ENCOUNTER — Ambulatory Visit (HOSPITAL_COMMUNITY): Payer: BLUE CROSS/BLUE SHIELD | Admitting: Anesthesiology

## 2017-12-13 ENCOUNTER — Encounter (HOSPITAL_COMMUNITY)
Admission: AD | Disposition: A | Discharge status: Home / Self Care | Payer: Self-pay | Source: Ambulatory Visit | Attending: Obstetrics and Gynecology

## 2017-12-13 ENCOUNTER — Encounter (HOSPITAL_COMMUNITY): Payer: Self-pay | Admitting: *Deleted

## 2017-12-13 DIAGNOSIS — O021 Missed abortion: Secondary | ICD-10-CM | POA: Diagnosis not present

## 2017-12-13 DIAGNOSIS — Z79899 Other long term (current) drug therapy: Secondary | ICD-10-CM | POA: Diagnosis not present

## 2017-12-13 DIAGNOSIS — O0289 Other abnormal products of conception: Secondary | ICD-10-CM

## 2017-12-13 DIAGNOSIS — Z9689 Presence of other specified functional implants: Secondary | ICD-10-CM | POA: Insufficient documentation

## 2017-12-13 HISTORY — DX: Other specified postprocedural states: Z98.890

## 2017-12-13 HISTORY — DX: Other specified postprocedural states: R11.2

## 2017-12-13 HISTORY — DX: Anxiety disorder, unspecified: F41.9

## 2017-12-13 HISTORY — DX: Personal history of urinary calculi: Z87.442

## 2017-12-13 HISTORY — PX: DILATION AND EVACUATION: SHX1459

## 2017-12-13 LAB — CBC
HCT: 36.7 % (ref 36.0–46.0)
Hemoglobin: 12.3 g/dL (ref 12.0–15.0)
MCH: 29.5 pg (ref 26.0–34.0)
MCHC: 33.5 g/dL (ref 30.0–36.0)
MCV: 88 fL (ref 78.0–100.0)
Platelets: 254 10*3/uL (ref 150–400)
RBC: 4.17 MIL/uL (ref 3.87–5.11)
RDW: 13.6 % (ref 11.5–15.5)
WBC: 9.5 10*3/uL (ref 4.0–10.5)

## 2017-12-13 LAB — TYPE AND SCREEN
ABO/RH(D): A POS
Antibody Screen: NEGATIVE

## 2017-12-13 SURGERY — DILATION AND EVACUATION, UTERUS
Anesthesia: Monitor Anesthesia Care | Site: Vagina

## 2017-12-13 MED ORDER — DEXAMETHASONE SODIUM PHOSPHATE 10 MG/ML IJ SOLN
INTRAMUSCULAR | Status: AC
  Filled 2017-12-13: qty 1

## 2017-12-13 MED ORDER — ONDANSETRON HCL 4 MG/2ML IJ SOLN
INTRAMUSCULAR | Status: DC | PRN
  Administered 2017-12-13: 4 mg via INTRAVENOUS

## 2017-12-13 MED ORDER — LACTATED RINGERS IV SOLN
INTRAVENOUS | Status: DC
  Administered 2017-12-13: 07:00:00 via INTRAVENOUS

## 2017-12-13 MED ORDER — LIDOCAINE HCL 1 % IJ SOLN
INTRAMUSCULAR | Status: DC | PRN
  Administered 2017-12-13: 10 mL

## 2017-12-13 MED ORDER — LACTATED RINGERS IV SOLN: INTRAVENOUS | Status: DC

## 2017-12-13 MED ORDER — FENTANYL CITRATE (PF) 100 MCG/2ML IJ SOLN
INTRAMUSCULAR | Status: AC
  Filled 2017-12-13: qty 2

## 2017-12-13 MED ORDER — SCOPOLAMINE 1 MG/3DAYS TD PT72
MEDICATED_PATCH | TRANSDERMAL | Status: AC
  Filled 2017-12-13: qty 1

## 2017-12-13 MED ORDER — ONDANSETRON HCL 4 MG/2ML IJ SOLN
INTRAMUSCULAR | Status: AC
  Filled 2017-12-13: qty 2

## 2017-12-13 MED ORDER — MIDAZOLAM HCL 2 MG/2ML IJ SOLN
INTRAMUSCULAR | Status: AC
  Filled 2017-12-13: qty 2

## 2017-12-13 MED ORDER — LIDOCAINE HCL 1 % IJ SOLN
INTRAMUSCULAR | Status: AC
  Filled 2017-12-13: qty 20

## 2017-12-13 MED ORDER — MIDAZOLAM HCL 5 MG/5ML IJ SOLN
INTRAMUSCULAR | Status: DC | PRN
  Administered 2017-12-13: 2 mg via INTRAVENOUS

## 2017-12-13 MED ORDER — FENTANYL CITRATE (PF) 100 MCG/2ML IJ SOLN
INTRAMUSCULAR | Status: DC | PRN
  Administered 2017-12-13: 100 ug via INTRAVENOUS

## 2017-12-13 MED ORDER — PROPOFOL 500 MG/50ML IV EMUL
INTRAVENOUS | Status: AC
  Filled 2017-12-13: qty 50

## 2017-12-13 MED ORDER — LIDOCAINE HCL (CARDIAC) 20 MG/ML IV SOLN
INTRAVENOUS | Status: DC | PRN
  Administered 2017-12-13: 40 mg via INTRAVENOUS

## 2017-12-13 MED ORDER — FENTANYL CITRATE (PF) 100 MCG/2ML IJ SOLN: 25.0000 ug | INTRAMUSCULAR | Status: DC | PRN

## 2017-12-13 MED ORDER — PROPOFOL 500 MG/50ML IV EMUL
INTRAVENOUS | Status: DC | PRN
  Administered 2017-12-13: 150 ug/kg/min via INTRAVENOUS

## 2017-12-13 MED ORDER — OXYCODONE HCL 5 MG PO TABS: 5.0000 mg | ORAL_TABLET | Freq: Once | ORAL | Status: DC | PRN

## 2017-12-13 MED ORDER — LIDOCAINE HCL (CARDIAC) 20 MG/ML IV SOLN
INTRAVENOUS | Status: AC
  Filled 2017-12-13: qty 5

## 2017-12-13 MED ORDER — PROMETHAZINE HCL 25 MG/ML IJ SOLN: 6.2500 mg | INTRAMUSCULAR | Status: DC | PRN

## 2017-12-13 MED ORDER — DEXAMETHASONE SODIUM PHOSPHATE 10 MG/ML IJ SOLN
INTRAMUSCULAR | Status: DC | PRN
  Administered 2017-12-13: 10 mg via INTRAVENOUS

## 2017-12-13 MED ORDER — GENTAMICIN SULFATE 40 MG/ML IJ SOLN
INTRAMUSCULAR | Status: AC
  Administered 2017-12-13: 100 mL via INTRAVENOUS
  Filled 2017-12-13: qty 7.5

## 2017-12-13 MED ORDER — SCOPOLAMINE 1 MG/3DAYS TD PT72
1.0000 | MEDICATED_PATCH | Freq: Once | TRANSDERMAL | Status: DC
  Administered 2017-12-13: 1.5 mg via TRANSDERMAL

## 2017-12-13 MED ORDER — OXYCODONE HCL 5 MG/5ML PO SOLN: 5.0000 mg | Freq: Once | ORAL | Status: DC | PRN

## 2017-12-13 MED ORDER — KETOROLAC TROMETHAMINE 30 MG/ML IJ SOLN
INTRAMUSCULAR | Status: DC | PRN
  Administered 2017-12-13: 30 mg via INTRAVENOUS

## 2017-12-13 SURGICAL SUPPLY — 18 items
CATH ROBINSON RED A/P 16FR (CATHETERS) ×3 IMPLANT
DECANTER SPIKE VIAL GLASS SM (MISCELLANEOUS) ×3 IMPLANT
GLOVE BIO SURGEON STRL SZ 6.5 (GLOVE) ×4 IMPLANT
GLOVE BIO SURGEONS STRL SZ 6.5 (GLOVE) ×2
GLOVE BIOGEL PI IND STRL 7.0 (GLOVE) ×1 IMPLANT
GLOVE BIOGEL PI INDICATOR 7.0 (GLOVE) ×2
GOWN STRL REUS W/TWL LRG LVL3 (GOWN DISPOSABLE) ×6 IMPLANT
KIT BERKELEY 1ST TRIMESTER 3/8 (MISCELLANEOUS) ×3 IMPLANT
NS IRRIG 1000ML POUR BTL (IV SOLUTION) ×3 IMPLANT
PACK VAGINAL MINOR WOMEN LF (CUSTOM PROCEDURE TRAY) ×3 IMPLANT
PAD OB MATERNITY 4.3X12.25 (PERSONAL CARE ITEMS) ×3 IMPLANT
PAD PREP 24X48 CUFFED NSTRL (MISCELLANEOUS) ×3 IMPLANT
SET BERKELEY SUCTION TUBING (SUCTIONS) ×3 IMPLANT
TOWEL OR 17X24 6PK STRL BLUE (TOWEL DISPOSABLE) ×6 IMPLANT
VACURETTE 10 RIGID CVD (CANNULA) IMPLANT
VACURETTE 7MM CVD STRL WRAP (CANNULA) IMPLANT
VACURETTE 8 RIGID CVD (CANNULA) ×3 IMPLANT
VACURETTE 9 RIGID CVD (CANNULA) IMPLANT

## 2017-12-13 NOTE — Transfer of Care (Signed)
Immediate Anesthesia Transfer of Care Note  Patient: Adrienne Morgan  Procedure(s) Performed: DILATATION AND EVACUATION with ultrasound guidance (N/A Vagina )  Patient Location: PACU  Anesthesia Type:MAC  Level of Consciousness: awake  Airway & Oxygen Therapy: Patient Spontanous Breathing and Patient connected to nasal cannula oxygen  Post-op Assessment: Report given to RN and Post -op Vital signs reviewed and stable  Post vital signs: stable  Last Vitals:  Vitals:   12/13/17 0626  BP: 111/75  Pulse: 89  Resp: 16  Temp: 36.7 C  SpO2: 100%    Last Pain:  Vitals:   12/13/17 0626  TempSrc: Oral      Patients Stated Pain Goal: 4 (83/25/49 8264)  Complications: No apparent anesthesia complications

## 2017-12-13 NOTE — Discharge Instructions (Signed)
DISCHARGE INSTRUCTIONS: D&C / D&E The following instructions have been prepared to help you care for yourself upon your return home.   Personal hygiene:  Use sanitary pads for vaginal drainage, not tampons.  Shower the day after your procedure.  NO tub baths, pools or Jacuzzis for 2-3 weeks.  Wipe front to back after using the bathroom.  Activity and limitations:  Do NOT drive or operate any equipment for 24 hours. The effects of anesthesia are still present and drowsiness may result.  Do NOT rest in bed all day.  Walking is encouraged.  Walk up and down stairs slowly.  You may resume your normal activity in one to two days or as indicated by your physician.  Sexual activity: NO intercourse for at least 2 weeks after the procedure, or as indicated by your physician.  Diet: Eat a light meal as desired this evening. You may resume your usual diet tomorrow.  Return to work: You may resume your work activities in one to two days or as indicated by your doctor.  What to expect after your surgery: Expect to have vaginal bleeding/discharge for 2-3 days and spotting for up to 10 days. It is not unusual to have soreness for up to 1-2 weeks. You may have a slight burning sensation when you urinate for the first day. Mild cramps may continue for a couple of days. You may have a regular period in 2-6 weeks.  Call your doctor for any of the following:  Excessive vaginal bleeding, saturating and changing one pad every hour.  Inability to urinate 6 hours after discharge from hospital.  Pain not relieved by pain medication.  Fever of 100.4 F or greater.  Unusual vaginal discharge or odor.   Call for an appointment:    Patients signature: ______________________  Nurses signature ________________________  Support person's signature_______________________      Post Anesthesia Home Care Instructions  Activity: Get plenty of rest for the remainder of the day. A responsible  individual must stay with you for 24 hours following the procedure.  For the next 24 hours, DO NOT: -Drive a car -Paediatric nurse -Drink alcoholic beverages -Take any medication unless instructed by your physician -Make any legal decisions or sign important papers.  Meals: Start with liquid foods such as gelatin or soup. Progress to regular foods as tolerated. Avoid greasy, spicy, heavy foods. If nausea and/or vomiting occur, drink only clear liquids until the nausea and/or vomiting subsides. Call your physician if vomiting continues.  Special Instructions/Symptoms: Your throat may feel dry or sore from the anesthesia or the breathing tube placed in your throat during surgery. If this causes discomfort, gargle with warm salt water. The discomfort should disappear within 24 hours.  If you had a scopolamine patch placed behind your ear for the management of post- operative nausea and/or vomiting:  1. The medication in the patch is effective for 72 hours, after which it should be removed.  Wrap patch in a tissue and discard in the trash. Wash hands thoroughly with soap and water. 2. You may remove the patch earlier than 72 hours if you experience unpleasant side effects which may include dry mouth, dizziness or visual disturbances. 3. Avoid touching the patch. Wash your hands with soap and water after contact with the patch.     NO IBUPROFEN PRODUCTS (MOTRIN, ADVIL) OR ALEVE UNTIL 1:45PM TODAY.

## 2017-12-13 NOTE — Brief Op Note (Signed)
12/13/2017  7:57 AM  PATIENT:  Laverle Patter  42 y.o. female  PRE-OPERATIVE DIAGNOSIS:  Missed Abortion  POST-OPERATIVE DIAGNOSIS:  Missed Abortion  PROCEDURE:  Procedure(s): DILATATION AND EVACUATION with ultrasound guidance (N/A)  SURGEON:  Surgeon(s) and Role:    * Dian Queen, MD - Primary  PHYSICIAN ASSISTANT:   ASSISTANTS: none   ANESTHESIA:   MAC  EBL:  25 mL   BLOOD ADMINISTERED:none  DRAINS: none   LOCAL MEDICATIONS USED:  LIDOCAINE   SPECIMEN:  Source of Specimen:  POC  DISPOSITION OF SPECIMEN:  PATHOLOGY  COUNTS:  YES  TOURNIQUET:  * No tourniquets in log *  DICTATION: .Other Dictation: Dictation Number dictated  PLAN OF CARE: Discharge to home after PACU  PATIENT DISPOSITION:  PACU - hemodynamically stable.   Delay start of Pharmacological VTE agent (>24hrs) due to surgical blood loss or risk of bleeding: yes

## 2017-12-13 NOTE — Progress Notes (Signed)
H and P on the chart Will proceed with D and E with ultrasound guidance Consent signed

## 2017-12-13 NOTE — Anesthesia Postprocedure Evaluation (Signed)
Anesthesia Post Note  Patient: Adrienne Morgan  Procedure(s) Performed: DILATATION AND EVACUATION with ultrasound guidance (N/A Vagina )     Patient location during evaluation: PACU Anesthesia Type: MAC Level of consciousness: awake and alert Pain management: pain level controlled Vital Signs Assessment: post-procedure vital signs reviewed and stable Respiratory status: spontaneous breathing, nonlabored ventilation and respiratory function stable Cardiovascular status: stable and blood pressure returned to baseline Anesthetic complications: no    Last Vitals:  Vitals:   12/13/17 0832 12/13/17 0900  BP: 104/61 116/64  Pulse: 82 87  Resp: 20 20  Temp: 36.9 C   SpO2: 98% 100%    Last Pain:  Vitals:   12/13/17 0900  TempSrc:   PainSc: 3    Pain Goal: Patients Stated Pain Goal: 4 (12/13/17 0626)               [name]

## 2017-12-14 ENCOUNTER — Encounter (HOSPITAL_COMMUNITY): Payer: Self-pay | Admitting: Obstetrics and Gynecology

## 2017-12-17 NOTE — Op Note (Signed)
NAMELAKETHIA, COPPESS NO.:  [id number]  MEDICAL RECORD NO.:  [id number]  LOCATION:                                 FACILITY:  PHYSICIAN:  Zakee Deerman L. Helane Rima, M.D.    DATE OF BIRTH:  DATE OF PROCEDURE:  12/12/2017 DATE OF DISCHARGE:                              OPERATIVE REPORT   PREOPERATIVE DIAGNOSIS:  Missed abortion.  POSTOPERATIVE DIAGNOSIS:  Missed abortion.  PROCEDURE:  Dilatation and evacuation with ultrasound guidance.  SURGEON:  Hadriel Northup L. Helane Rima, M.D.  ESTIMATED BLOOD LOSS:  25 mL.  COMPLICATIONS:  None.  ANESTHESIA:  MAC.  PATHOLOGY:  Products of conception.  DESCRIPTION OF PROCEDURE:  The patient was taken to the operating room after consent had been obtained.  She was then prepped and draped in the usual sterile fashion.  A speculum was inserted.  The cervix was grasped with a tenaculum and a paracervical block was performed.  Using ultrasound guidance, we can visualize the missed AB.  I carefully dilated the internal os and then inserted a #8 suction cannula.  With direct visualization, I was able to suction the products of conception x2.  I then removed the suction cannula and then inserted a sharp curette.  The uterus was thoroughly curetted of all tissue and a final suction curettage was performed.  At the end of the procedure, all instruments were removed from the vagina.  There was no vaginal bleeding noted.  The patient tolerated the procedure well and went to recovery room in stable condition.     Tiny Rietz L. Helane Rima, M.D.     Nevin Bloodgood  D:  12/17/2017  T:  12/17/2017  Job:  [id number]

## 2018-01-16 DIAGNOSIS — Z30430 Encounter for insertion of intrauterine contraceptive device: Secondary | ICD-10-CM | POA: Diagnosis not present

## 2018-02-27 ENCOUNTER — Encounter: Payer: Self-pay | Admitting: Internal Medicine

## 2018-03-01 DIAGNOSIS — Z30431 Encounter for routine checking of intrauterine contraceptive device: Secondary | ICD-10-CM | POA: Diagnosis not present

## 2018-04-12 ENCOUNTER — Encounter: Payer: Self-pay | Admitting: *Deleted

## 2018-05-06 ENCOUNTER — Encounter: Payer: Self-pay | Admitting: Internal Medicine

## 2018-05-06 ENCOUNTER — Ambulatory Visit (INDEPENDENT_AMBULATORY_CARE_PROVIDER_SITE_OTHER): Payer: BLUE CROSS/BLUE SHIELD | Admitting: Internal Medicine

## 2018-05-06 ENCOUNTER — Other Ambulatory Visit: Payer: BLUE CROSS/BLUE SHIELD

## 2018-05-06 VITALS — BP 104/60 | HR 88 | Ht 63.29 in | Wt 141.1 lb

## 2018-05-06 DIAGNOSIS — R1012 Left upper quadrant pain: Secondary | ICD-10-CM

## 2018-05-06 DIAGNOSIS — Z800 Family history of malignant neoplasm of digestive organs: Secondary | ICD-10-CM

## 2018-05-06 MED ORDER — SUPREP BOWEL PREP KIT 17.5-3.13-1.6 GM/177ML PO SOLN: 1.0000 | ORAL | 0 refills | Status: DC

## 2018-05-06 NOTE — Patient Instructions (Signed)
You have been scheduled for a colonoscopy. Please follow written instructions given to you at your visit today.  Please pick up your prep supplies at the pharmacy within the next 1-3 days. If you use inhalers (even only as needed), please bring them with you on the day of your procedure. Your physician has requested that you go to www.startemmi.com and enter the access code given to you at your visit today. This web site gives a general overview about your procedure. However, you should still follow specific instructions given to you by our office regarding your preparation for the procedure.  Your provider has requested that you go to the basement level for lab work before leaving today. Press "B" on the elevator. The lab is located at the first door on the left as you exit the elevator.  If you are age 33 or older, your body mass index should be between 23-30. Your Body mass index is 24.77 kg/m. If this is out of the aforementioned range listed, please consider follow up with your Primary Care Provider.  If you are age 66 or younger, your body mass index should be between 19-25. Your Body mass index is 24.77 kg/m. If this is out of the aformentioned range listed, please consider follow up with your Primary Care Provider.

## 2018-05-06 NOTE — Progress Notes (Signed)
Patient ID: Adrienne Morgan, female   DOB: 07-14-1976, 42 y.o.   MRN: [id number] HPI: Adrienne Morgan is a 42 year old female with little past medical history and a family history of colon cancer in her mother who is seen in consultation at the request of Dr. Dagmar Hait to discuss her family history of colon cancer.  She is here alone today.  She states that her mother was diagnosed with initially stage II colon cancer of the cecum now progressed to stage IV last year when she was 70 years old.  Her mother was adopted so has little family medical history that she is aware of.  Her tumor was found to be microsatellite stable and her mismatch repair analysis was negative.  Her father has also had colon polyps.  The patient has never had a colonoscopy.  She states that her bowel habits have been normal and regular for her which is bowel movement daily.  No blood in her stool or melena.  She will occasionally see scant red blood with wiping which she has attributed to hemorrhoids since childbirth 4 years ago.  No other hemorrhoidal symptoms.  No change in bowel habit including no diarrhea or constipation.  No melena or blood in stool.  For years she has had a left upper quadrant "colicky" type abdominal discomfort.  Tums seems to help with this.  It is worse with stress.  Does not seem to relate to food.  Can be gnawing and is very intermittent.  Not burning in nature.  No heartburn or reflux type symptoms.  No trouble swallowing.  She works as a Marine scientist at the [employer].  She has 67 60-year-old son.  Past tobacco use none recently.  1-3 alcoholic drinks per week.  No illicit drug use.  Past surgical history left rotator cuff repair, dilation and curettage x3 with previous miscarriages, C5-C6 disc replacement.  Past Medical History:  Diagnosis Date  . Anxiety    No meds  . History of kidney stones    passed stone - no surgery required  . PCOS (polycystic ovarian syndrome)   . Pneumonia   . PONV  (postoperative nausea and vomiting)   . SVD (spontaneous vaginal delivery)    x 1    Past Surgical History:  Procedure Laterality Date  . cervical disk      C5-C6 replacement  . DILATION AND CURETTAGE OF UTERUS  2011   missed  . DILATION AND EVACUATION  09/27/2011   Procedure: DILATATION AND EVACUATION;  Surgeon: Cyril Mourning, MD;  Location: [facility];  Service: Gynecology;  Laterality: N/A;  with Ultrasound Guidance and Chromosomal Studies  . DILATION AND EVACUATION N/A 12/13/2017   Procedure: DILATATION AND EVACUATION with ultrasound guidance;  Surgeon: Dian Queen, MD;  Location: [facility];  Service: Gynecology;  Laterality: N/A;  . ROTATOR CUFF REPAIR Left   . TONSILLECTOMY AND ADENOIDECTOMY      Outpatient Medications Prior to Visit  Medication Sig Dispense Refill  . escitalopram (LEXAPRO) 10 MG tablet Take 10 mg by mouth daily.  3  . ibuprofen (ADVIL,MOTRIN) 600 MG tablet Take 1 tablet (600 mg total) by mouth every 6 (six) hours. (Patient not taking: Reported on 12/10/2017) 30 tablet 1  . oxyCODONE-acetaminophen (PERCOCET/ROXICET) 5-325 MG per tablet Take 1-2 tablets by mouth every 4 (four) hours as needed for severe pain (moderate - severe pain). (Patient not taking: Reported on 12/10/2017) 30 tablet 0  . Prenatal Vit-Fe Fumarate-FA (PRENATAL MULTIVITAMIN) TABS Take 1  tablet by mouth daily.     . sertraline (ZOLOFT) 25 MG tablet Take 1 tablet (25 mg total) by mouth daily. (Patient not taking: Reported on 12/10/2017) 30 tablet 1   No facility-administered medications prior to visit.     Allergies  Allergen Reactions  . Augmentin [Amoxicillin-Pot Clavulanate] Hives and Other (See Comments)    Pt tolerates plain amoxicillin/penicillin with no problems    Family History  Problem Relation Age of Onset  . Hypertension Father   . Diabetes Father   . Colon polyps Father   . Cirrhosis Father   . COPD Paternal Grandfather   . Esophageal cancer Paternal Grandfather   . Colon  cancer Mother 66  . Colon polyps Mother   . Irritable bowel syndrome Sister     Social History   Tobacco Use  . Smoking status: Former Smoker    Years: 3.00  . Smokeless tobacco: Never Used  . Tobacco comment: college years  Substance Use Topics  . Alcohol use: Yes    Comment: 1-3 per week  . Drug use: No    ROS: As per history of present illness, otherwise negative  BP 104/60 (BP Location: Left Arm, Patient Position: Sitting, Cuff Size: Normal)   Pulse 88   Ht 5' 3.29" (1.608 m) Comment: height measured without shoes  Wt 141 lb 2 oz (64 kg)   LMP 04/29/2018   Breastfeeding? No   BMI 24.77 kg/m  Constitutional: Well-developed and well-nourished. No distress. HEENT: Normocephalic and atraumatic. Oropharynx is clear and moist. Conjunctivae are normal.  No scleral icterus. Neck: Neck supple. Trachea midline. Cardiovascular: Normal rate, regular rhythm and intact distal pulses. No M/R/G Pulmonary/chest: Effort normal and breath sounds normal. No wheezing, rales or rhonchi. Abdominal: Soft, nontender, nondistended. Bowel sounds active throughout. There are no masses palpable. No hepatosplenomegaly. Extremities: no clubbing, cyanosis, or edema Neurological: Alert and oriented to person place and time. Skin: Skin is warm and dry.  Psychiatric: Normal mood and affect. Behavior is normal.   ASSESSMENT/PLAN: 42 year old female with little past medical history and a family history of colon cancer in her mother who is seen in consultation at the request of Dr. Dagmar Hait to discuss her family history of colon cancer.  1. Family history of colon cancer in mother --I have recommended we proceed with screening colonoscopy at this time based on her family history.  We discussed the risk, benefits and alternatives and she is agreeable and wishes to proceed.  2.  Left upper quadrant pain --intermittent and long-standing.  I recommended we check H. pylori stool antigen.  I also recommended she  try ranitidine 150 mg twice daily x2 weeks to see if the pain is affected with acid suppression.  If worsening we can evaluate further.    EK:CMKL, [address], [address] [place], [place] [address]

## 2018-06-20 ENCOUNTER — Ambulatory Visit (AMBULATORY_SURGERY_CENTER): Payer: BLUE CROSS/BLUE SHIELD | Admitting: Internal Medicine

## 2018-06-20 ENCOUNTER — Encounter: Payer: Self-pay | Admitting: Internal Medicine

## 2018-06-20 VITALS — BP 111/63 | HR 78 | Temp 99.6°F | Resp 12 | Ht 63.0 in | Wt 141.0 lb

## 2018-06-20 DIAGNOSIS — D120 Benign neoplasm of cecum: Secondary | ICD-10-CM | POA: Diagnosis not present

## 2018-06-20 DIAGNOSIS — Z800 Family history of malignant neoplasm of digestive organs: Secondary | ICD-10-CM | POA: Diagnosis not present

## 2018-06-20 DIAGNOSIS — Z1211 Encounter for screening for malignant neoplasm of colon: Secondary | ICD-10-CM

## 2018-06-20 MED ORDER — SODIUM CHLORIDE 0.9 % IV SOLN: 500.0000 mL | Freq: Once | INTRAVENOUS | Status: DC

## 2018-06-20 NOTE — Progress Notes (Signed)
Called to room to assist during endoscopic procedure.  Patient ID and intended procedure confirmed with present staff. Received instructions for my participation in the procedure from the performing physician.  

## 2018-06-20 NOTE — Op Note (Signed)
[facility] Patient Name: Adrienne Morgan Procedure Date: 06/20/2018 2:59 PM MRN: [id number] Endoscopist: Jerene Bears , MD Age: 42 Referring MD:  Date of Birth: 17-Feb-1976 Gender: Female Account #: [id number] Procedure:                Colonoscopy Indications:              Screening patient at increased risk: Family history                            of 1st-degree relative with colorectal cancer at                            age 68 years (or older), This is the patient's                            first colonoscopy Medicines:                Monitored Anesthesia Care Procedure:                Pre-Anesthesia Assessment:                           - Prior to the procedure, a History and Physical                            was performed, and patient medications and                            allergies were reviewed. The patient's tolerance of                            previous anesthesia was also reviewed. The risks                            and benefits of the procedure and the sedation                            options and risks were discussed with the patient.                            All questions were answered, and informed consent                            was obtained. Prior Anticoagulants: The patient has                            taken no previous anticoagulant or antiplatelet                            agents. ASA Grade Assessment: II - A patient with                            mild systemic disease. After reviewing the risks  and benefits, the patient was deemed in                            satisfactory condition to undergo the procedure.                           After obtaining informed consent, the colonoscope                            was passed under direct vision. Throughout the                            procedure, the patient's blood pressure, pulse, and                            oxygen saturations were monitored continuously.  The                            Model PCF-H190DL [id number]) scope was introduced                            through the anus and advanced to the cecum,                            identified by appendiceal orifice and ileocecal                            valve. Scope In: 3:06:44 PM Scope Out: 3:23:32 PM Scope Withdrawal Time: 0 hours 12 minutes 3 seconds  Total Procedure Duration: 0 hours 16 minutes 48 seconds  Findings:                 The digital rectal exam was normal.                           A 3 mm polyp was found in the cecum. The polyp was                            sessile. The polyp was removed with a cold snare.                            Resection and retrieval were complete.                           External hemorrhoids were found during                            retroflexion. The hemorrhoids were small.                           The exam was otherwise without abnormality. Complications:            No immediate complications. Estimated Blood Loss:     Estimated blood loss was minimal. Impression:               - One 3  mm polyp in the cecum, removed with a cold                            snare. Resected and retrieved.                           - Small external hemorrhoids.                           - The examination was otherwise normal. Recommendation:           - Patient has a contact number available for                            emergencies. The signs and symptoms of potential                            delayed complications were discussed with the                            patient. Return to normal activities tomorrow.                            Written discharge instructions were provided to the                            patient.                           - Resume previous diet.                           - Continue present medications.                           - Await pathology results.                           - Repeat colonoscopy in 5 years. Jerene Bears,  MD 06/20/2018 3:27:24 PM This report has been signed electronically.

## 2018-06-20 NOTE — Patient Instructions (Signed)
Thank you for allowing [place] to care for you today!  Resume previous diet and medications.  Return to normal activities tomorrow.  Await pathology results, approx 2 weeks.  Follow up colonoscopy in 5 years unless issues arise  or Dr Hilarie Fredrickson recommends otherwise.     YOU HAD AN ENDOSCOPIC PROCEDURE TODAY AT [facility]:   Refer to the procedure report that was given to you for any specific questions about what was found during the examination.  If the procedure report does not answer your questions, please call your gastroenterologist to clarify.  If you requested that your care partner not be given the details of your procedure findings, then the procedure report has been included in a sealed envelope for you to review at your convenience later.  YOU SHOULD EXPECT: Some feelings of bloating in the abdomen. Passage of more gas than usual.  Walking can help get rid of the air that was put into your GI tract during the procedure and reduce the bloating. If you had a lower endoscopy (such as a colonoscopy or flexible sigmoidoscopy) you may notice spotting of blood in your stool or on the toilet paper. If you underwent a bowel prep for your procedure, you may not have a normal bowel movement for a few days.  Please Note:  You might notice some irritation and congestion in your nose or some drainage.  This is from the oxygen used during your procedure.  There is no need for concern and it should clear up in a day or so.  SYMPTOMS TO REPORT IMMEDIATELY:   Following lower endoscopy (colonoscopy or flexible sigmoidoscopy):  Excessive amounts of blood in the stool  Significant tenderness or worsening of abdominal pains  Swelling of the abdomen that is new, acute  Fever of 100F or higher   For urgent or emergent issues, a gastroenterologist can be reached at any hour by calling [phone].   DIET:  We do recommend a small meal at first, but then you may proceed to your regular  diet.  Drink plenty of fluids but you should avoid alcoholic beverages for 24 hours.  ACTIVITY:  You should plan to take it easy for the rest of today and you should NOT DRIVE or use heavy machinery until tomorrow (because of the sedation medicines used during the test).    FOLLOW UP: Our staff will call the number listed on your records the next business day following your procedure to check on you and address any questions or concerns that you may have regarding the information given to you following your procedure. If we do not reach you, we will leave a message.  However, if you are feeling well and you are not experiencing any problems, there is no need to return our call.  We will assume that you have returned to your regular daily activities without incident.  If any biopsies were taken you will be contacted by phone or by letter within the next 1-3 weeks.  Please call us at [phone] if you have not heard about the biopsies in 3 weeks.    SIGNATURES/CONFIDENTIALITY: You and/or your care partner have signed paperwork which will be entered into your electronic medical record.  These signatures attest to the fact that that the information above on your After Visit Summary has been reviewed and is understood.  Full responsibility of the confidentiality of this discharge information lies with you and/or your care-partner.

## 2018-06-20 NOTE — Progress Notes (Signed)
To PACU, VSS. Report to [email or web address] 

## 2018-06-21 ENCOUNTER — Telehealth: Payer: Self-pay

## 2018-06-21 NOTE — Telephone Encounter (Signed)
  Follow up Call-  Call back number 06/20/2018  Post procedure Call Back phone  # [phone]  Permission to leave phone message Yes  Some recent data might be hidden     Patient questions:  Do you have a fever, pain , or abdominal swelling? No. Pain Score  0 *  Have you tolerated food without any problems? Yes.    Have you been able to return to your normal activities? Yes.    Do you have any questions about your discharge instructions: Diet   No. Medications  No. Follow up visit  No.  Do you have questions or concerns about your Care? No.  Actions: * If pain score is 4 or above: No action needed, pain <4.

## 2018-06-25 ENCOUNTER — Encounter: Payer: Self-pay | Admitting: Internal Medicine

## 2018-07-10 DIAGNOSIS — R922 Inconclusive mammogram: Secondary | ICD-10-CM | POA: Diagnosis not present

## 2018-07-10 DIAGNOSIS — Z1231 Encounter for screening mammogram for malignant neoplasm of breast: Secondary | ICD-10-CM | POA: Diagnosis not present

## 2018-07-18 DIAGNOSIS — Z23 Encounter for immunization: Secondary | ICD-10-CM | POA: Diagnosis not present

## 2018-10-23 DIAGNOSIS — Z0000 Encounter for general adult medical examination without abnormal findings: Secondary | ICD-10-CM | POA: Diagnosis not present

## 2018-10-28 DIAGNOSIS — L309 Dermatitis, unspecified: Secondary | ICD-10-CM | POA: Diagnosis not present

## 2018-10-28 DIAGNOSIS — L738 Other specified follicular disorders: Secondary | ICD-10-CM | POA: Diagnosis not present

## 2018-10-28 DIAGNOSIS — L821 Other seborrheic keratosis: Secondary | ICD-10-CM | POA: Diagnosis not present

## 2018-10-28 DIAGNOSIS — L700 Acne vulgaris: Secondary | ICD-10-CM | POA: Diagnosis not present

## 2018-10-30 DIAGNOSIS — F39 Unspecified mood [affective] disorder: Secondary | ICD-10-CM | POA: Diagnosis not present

## 2018-10-30 DIAGNOSIS — E282 Polycystic ovarian syndrome: Secondary | ICD-10-CM | POA: Diagnosis not present

## 2018-10-30 DIAGNOSIS — Z0000 Encounter for general adult medical examination without abnormal findings: Secondary | ICD-10-CM | POA: Diagnosis not present

## 2018-10-30 DIAGNOSIS — E010 Iodine-deficiency related diffuse (endemic) goiter: Secondary | ICD-10-CM | POA: Diagnosis not present

## 2018-10-30 DIAGNOSIS — Z800 Family history of malignant neoplasm of digestive organs: Secondary | ICD-10-CM | POA: Diagnosis not present

## 2018-10-30 DIAGNOSIS — Z1389 Encounter for screening for other disorder: Secondary | ICD-10-CM | POA: Diagnosis not present

## 2019-05-22 ENCOUNTER — Other Ambulatory Visit: Payer: Self-pay | Admitting: Radiology

## 2019-05-22 DIAGNOSIS — N6310 Unspecified lump in the right breast, unspecified quadrant: Secondary | ICD-10-CM | POA: Diagnosis not present

## 2019-05-27 ENCOUNTER — Ambulatory Visit
Admission: RE | Admit: 2019-05-27 | Discharge: 2019-05-27 | Disposition: A | Discharge status: Home / Self Care | Payer: BLUE CROSS/BLUE SHIELD | Source: Ambulatory Visit | Attending: Radiology | Admitting: Radiology

## 2019-05-27 ENCOUNTER — Other Ambulatory Visit: Payer: Self-pay

## 2019-05-27 DIAGNOSIS — N6310 Unspecified lump in the right breast, unspecified quadrant: Secondary | ICD-10-CM

## 2019-08-28 DIAGNOSIS — Z6824 Body mass index (BMI) 24.0-24.9, adult: Secondary | ICD-10-CM | POA: Diagnosis not present

## 2019-08-28 DIAGNOSIS — Z1231 Encounter for screening mammogram for malignant neoplasm of breast: Secondary | ICD-10-CM | POA: Diagnosis not present

## 2019-08-28 DIAGNOSIS — Z01419 Encounter for gynecological examination (general) (routine) without abnormal findings: Secondary | ICD-10-CM | POA: Diagnosis not present

## 2019-11-14 DIAGNOSIS — E010 Iodine-deficiency related diffuse (endemic) goiter: Secondary | ICD-10-CM | POA: Diagnosis not present

## 2019-11-14 DIAGNOSIS — Z0000 Encounter for general adult medical examination without abnormal findings: Secondary | ICD-10-CM | POA: Diagnosis not present

## 2019-11-14 DIAGNOSIS — Z79899 Other long term (current) drug therapy: Secondary | ICD-10-CM | POA: Diagnosis not present

## 2019-11-21 DIAGNOSIS — Z1331 Encounter for screening for depression: Secondary | ICD-10-CM | POA: Diagnosis not present

## 2019-11-21 DIAGNOSIS — E282 Polycystic ovarian syndrome: Secondary | ICD-10-CM | POA: Diagnosis not present

## 2019-11-21 DIAGNOSIS — F39 Unspecified mood [affective] disorder: Secondary | ICD-10-CM | POA: Diagnosis not present

## 2019-11-21 DIAGNOSIS — Z0000 Encounter for general adult medical examination without abnormal findings: Secondary | ICD-10-CM | POA: Diagnosis not present

## 2019-11-21 DIAGNOSIS — Z800 Family history of malignant neoplasm of digestive organs: Secondary | ICD-10-CM | POA: Diagnosis not present

## 2019-11-21 DIAGNOSIS — E010 Iodine-deficiency related diffuse (endemic) goiter: Secondary | ICD-10-CM | POA: Diagnosis not present

## 2020-09-06 DIAGNOSIS — Z01419 Encounter for gynecological examination (general) (routine) without abnormal findings: Secondary | ICD-10-CM | POA: Diagnosis not present

## 2020-09-06 DIAGNOSIS — Z6825 Body mass index (BMI) 25.0-25.9, adult: Secondary | ICD-10-CM | POA: Diagnosis not present

## 2020-09-06 DIAGNOSIS — Z1231 Encounter for screening mammogram for malignant neoplasm of breast: Secondary | ICD-10-CM | POA: Diagnosis not present

## 2020-10-29 DIAGNOSIS — Z1159 Encounter for screening for other viral diseases: Secondary | ICD-10-CM | POA: Diagnosis not present

## 2020-11-26 DIAGNOSIS — E010 Iodine-deficiency related diffuse (endemic) goiter: Secondary | ICD-10-CM | POA: Diagnosis not present

## 2020-11-26 DIAGNOSIS — Z79899 Other long term (current) drug therapy: Secondary | ICD-10-CM | POA: Diagnosis not present

## 2020-12-03 DIAGNOSIS — Z0000 Encounter for general adult medical examination without abnormal findings: Secondary | ICD-10-CM | POA: Diagnosis not present

## 2020-12-03 DIAGNOSIS — Z23 Encounter for immunization: Secondary | ICD-10-CM | POA: Diagnosis not present

## 2020-12-03 DIAGNOSIS — F39 Unspecified mood [affective] disorder: Secondary | ICD-10-CM | POA: Diagnosis not present

## 2021-01-10 DIAGNOSIS — N939 Abnormal uterine and vaginal bleeding, unspecified: Secondary | ICD-10-CM | POA: Diagnosis not present

## 2021-01-10 DIAGNOSIS — R6882 Decreased libido: Secondary | ICD-10-CM | POA: Diagnosis not present

## 2021-10-18 DIAGNOSIS — Z1231 Encounter for screening mammogram for malignant neoplasm of breast: Secondary | ICD-10-CM | POA: Diagnosis not present

## 2021-10-18 DIAGNOSIS — Z01419 Encounter for gynecological examination (general) (routine) without abnormal findings: Secondary | ICD-10-CM | POA: Diagnosis not present

## 2021-10-18 DIAGNOSIS — Z6825 Body mass index (BMI) 25.0-25.9, adult: Secondary | ICD-10-CM | POA: Diagnosis not present

## 2021-12-28 DIAGNOSIS — R7989 Other specified abnormal findings of blood chemistry: Secondary | ICD-10-CM | POA: Diagnosis not present

## 2021-12-28 DIAGNOSIS — E010 Iodine-deficiency related diffuse (endemic) goiter: Secondary | ICD-10-CM | POA: Diagnosis not present

## 2022-01-04 DIAGNOSIS — Z1331 Encounter for screening for depression: Secondary | ICD-10-CM | POA: Diagnosis not present

## 2022-01-04 DIAGNOSIS — F39 Unspecified mood [affective] disorder: Secondary | ICD-10-CM | POA: Diagnosis not present

## 2022-01-04 DIAGNOSIS — Z1389 Encounter for screening for other disorder: Secondary | ICD-10-CM | POA: Diagnosis not present

## 2022-01-04 DIAGNOSIS — Z0000 Encounter for general adult medical examination without abnormal findings: Secondary | ICD-10-CM | POA: Diagnosis not present

## 2022-05-17 DIAGNOSIS — Z23 Encounter for immunization: Secondary | ICD-10-CM | POA: Diagnosis not present

## 2022-05-17 DIAGNOSIS — S81812A Laceration without foreign body, left lower leg, initial encounter: Secondary | ICD-10-CM | POA: Diagnosis not present

## 2022-11-24 DIAGNOSIS — Z1231 Encounter for screening mammogram for malignant neoplasm of breast: Secondary | ICD-10-CM | POA: Diagnosis not present

## 2022-11-24 DIAGNOSIS — Z124 Encounter for screening for malignant neoplasm of cervix: Secondary | ICD-10-CM | POA: Diagnosis not present

## 2022-11-24 DIAGNOSIS — Z6825 Body mass index (BMI) 25.0-25.9, adult: Secondary | ICD-10-CM | POA: Diagnosis not present

## 2022-11-24 DIAGNOSIS — Z01419 Encounter for gynecological examination (general) (routine) without abnormal findings: Secondary | ICD-10-CM | POA: Diagnosis not present

## 2022-11-24 LAB — HM MAMMOGRAPHY

## 2022-11-30 ENCOUNTER — Other Ambulatory Visit: Payer: Self-pay | Admitting: Obstetrics and Gynecology

## 2022-11-30 DIAGNOSIS — R590 Localized enlarged lymph nodes: Secondary | ICD-10-CM

## 2022-12-15 DIAGNOSIS — R87612 Low grade squamous intraepithelial lesion on cytologic smear of cervix (LGSIL): Secondary | ICD-10-CM | POA: Diagnosis not present

## 2022-12-15 DIAGNOSIS — Z3202 Encounter for pregnancy test, result negative: Secondary | ICD-10-CM | POA: Diagnosis not present

## 2022-12-15 DIAGNOSIS — N72 Inflammatory disease of cervix uteri: Secondary | ICD-10-CM | POA: Diagnosis not present

## 2022-12-18 ENCOUNTER — Other Ambulatory Visit: Payer: Self-pay | Admitting: Obstetrics and Gynecology

## 2022-12-18 ENCOUNTER — Ambulatory Visit
Admission: RE | Admit: 2022-12-18 | Discharge: 2022-12-18 | Disposition: A | Discharge status: Home / Self Care | Payer: BC Managed Care – PPO | Source: Ambulatory Visit | Attending: Obstetrics and Gynecology | Admitting: Obstetrics and Gynecology

## 2022-12-18 DIAGNOSIS — R590 Localized enlarged lymph nodes: Secondary | ICD-10-CM

## 2022-12-19 ENCOUNTER — Other Ambulatory Visit (HOSPITAL_COMMUNITY)
Admission: RE | Admit: 2022-12-19 | Discharge: 2022-12-19 | Disposition: A | Discharge status: Home / Self Care | Payer: BC Managed Care – PPO | Source: Ambulatory Visit | Attending: Obstetrics and Gynecology | Admitting: Obstetrics and Gynecology

## 2022-12-19 ENCOUNTER — Ambulatory Visit
Admission: RE | Admit: 2022-12-19 | Discharge: 2022-12-19 | Disposition: A | Discharge status: Home / Self Care | Payer: BC Managed Care – PPO | Source: Ambulatory Visit | Attending: Obstetrics and Gynecology | Admitting: Obstetrics and Gynecology

## 2022-12-19 DIAGNOSIS — R590 Localized enlarged lymph nodes: Secondary | ICD-10-CM

## 2022-12-19 DIAGNOSIS — C8214 Follicular lymphoma grade II, lymph nodes of axilla and upper limb: Secondary | ICD-10-CM | POA: Diagnosis not present

## 2022-12-21 LAB — SURGICAL PATHOLOGY

## 2022-12-22 DIAGNOSIS — R87612 Low grade squamous intraepithelial lesion on cytologic smear of cervix (LGSIL): Secondary | ICD-10-CM | POA: Diagnosis not present

## 2022-12-25 ENCOUNTER — Encounter: Payer: Self-pay | Admitting: *Deleted

## 2022-12-25 ENCOUNTER — Other Ambulatory Visit: Payer: Self-pay | Admitting: *Deleted

## 2022-12-25 DIAGNOSIS — C8204 Follicular lymphoma grade I, lymph nodes of axilla and upper limb: Secondary | ICD-10-CM

## 2022-12-25 NOTE — Progress Notes (Signed)
PATIENT NAVIGATOR PROGRESS NOTE  Name: Adrienne Morgan Date: 12/25/2022 MRN: [id number]  DOB: 1976-07-11   Reason for visit:  New patient appt  Comments:  Called and spoke with pt and scheduled her with Dr Benay Spice on 01/01/23 for New patient appt.  Reviewed directions and parking as well as one support person allowed in the appt. Verbalized understanding and pt given contact information to call with any questions    Time spent counseling/coordinating care: 30-45 minutes

## 2023-01-01 ENCOUNTER — Inpatient Hospital Stay: Payer: BC Managed Care – PPO | Attending: Oncology | Admitting: Oncology

## 2023-01-01 ENCOUNTER — Encounter: Payer: Self-pay | Admitting: *Deleted

## 2023-01-01 ENCOUNTER — Other Ambulatory Visit: Payer: Self-pay | Admitting: *Deleted

## 2023-01-01 ENCOUNTER — Inpatient Hospital Stay: Payer: BC Managed Care – PPO

## 2023-01-01 ENCOUNTER — Encounter: Payer: Self-pay | Admitting: Oncology

## 2023-01-01 VITALS — BP 127/75 | HR 98 | Temp 98.2°F | Resp 18 | Ht 63.0 in | Wt 145.0 lb

## 2023-01-01 DIAGNOSIS — C8204 Follicular lymphoma grade I, lymph nodes of axilla and upper limb: Secondary | ICD-10-CM

## 2023-01-01 DIAGNOSIS — Z86010 Personal history of colonic polyps: Secondary | ICD-10-CM | POA: Insufficient documentation

## 2023-01-01 DIAGNOSIS — E282 Polycystic ovarian syndrome: Secondary | ICD-10-CM | POA: Diagnosis not present

## 2023-01-01 DIAGNOSIS — Z800 Family history of malignant neoplasm of digestive organs: Secondary | ICD-10-CM | POA: Diagnosis not present

## 2023-01-01 LAB — CBC WITH DIFFERENTIAL (CANCER CENTER ONLY)
Abs Immature Granulocytes: 0.01 10*3/uL (ref 0.00–0.07)
Basophils Absolute: 0 10*3/uL (ref 0.0–0.1)
Basophils Relative: 1 %
Eosinophils Absolute: 0 10*3/uL (ref 0.0–0.5)
Eosinophils Relative: 1 %
HCT: 42.1 % (ref 36.0–46.0)
Hemoglobin: 14 g/dL (ref 12.0–15.0)
Immature Granulocytes: 0 %
Lymphocytes Relative: 21 %
Lymphs Abs: 1.4 10*3/uL (ref 0.7–4.0)
MCH: 30.2 pg (ref 26.0–34.0)
MCHC: 33.3 g/dL (ref 30.0–36.0)
MCV: 90.9 fL (ref 80.0–100.0)
Monocytes Absolute: 0.4 10*3/uL (ref 0.1–1.0)
Monocytes Relative: 7 %
Neutro Abs: 4.7 10*3/uL (ref 1.7–7.7)
Neutrophils Relative %: 70 %
Platelet Count: 201 10*3/uL (ref 150–400)
RBC: 4.63 MIL/uL (ref 3.87–5.11)
RDW: 12.3 % (ref 11.5–15.5)
WBC Count: 6.6 10*3/uL (ref 4.0–10.5)
nRBC: 0 % (ref 0.0–0.2)

## 2023-01-01 LAB — CMP (CANCER CENTER ONLY)
ALT: 10 U/L (ref 0–44)
AST: 15 U/L (ref 15–41)
Albumin: 5.1 g/dL — ABNORMAL HIGH (ref 3.5–5.0)
Alkaline Phosphatase: 73 U/L (ref 38–126)
Anion gap: 8 (ref 5–15)
BUN: 10 mg/dL (ref 6–20)
CO2: 27 mmol/L (ref 22–32)
Calcium: 10.1 mg/dL (ref 8.9–10.3)
Chloride: 102 mmol/L (ref 98–111)
Creatinine: 0.72 mg/dL (ref 0.44–1.00)
GFR, Estimated: >60 mL/min (ref >60)
Glucose, Bld: 98 mg/dL (ref 70–99)
Potassium: 3.8 mmol/L (ref 3.5–5.1)
Sodium: 137 mmol/L (ref 135–145)
Total Bilirubin: 0.4 mg/dL (ref 0.3–1.2)
Total Protein: 7.8 g/dL (ref 6.5–8.1)

## 2023-01-01 LAB — LACTATE DEHYDROGENASE: LDH: 132 U/L (ref 98–192)

## 2023-01-01 NOTE — Progress Notes (Signed)
PATIENT NAVIGATOR PROGRESS NOTE  Name: Adrienne Morgan Date: 01/01/2023 MRN: [id number]  DOB: 17-Aug-1976   Reason for visit:  Initial appt with Dr Benay Spice  Comments:  Met with [name] during new pt appt with Dr Benay Spice Orders placed for PET scan for initial staging, scheduled for Thursday, 3/28, written instructions given Referral placed for Social Work  Given contact information to call with any questions Will return to clinic on 4/1 for PET scan review and treatment discussion    Time spent counseling/coordinating care: > 60 minutes

## 2023-01-01 NOTE — Progress Notes (Addendum)
[facility] New Patient Consult   Requesting MD: Prince Solian, [place],  [place] [address]   Adrienne Morgan 47 y.o.  [birth year]    Reason for Consult: Non-Hodgkin's lymphoma   HPI: [name] had a routine screening mammogram via her gynecologist.  There was suspicion of possible bilateral axillary lymphadenopathy.  An ultrasound evaluation on 12/18/2022 revealed full bilateral axillary lymph nodes measuring approximately 4 mm.  1 lymph node in the right axilla did not have a defined preserved fatty hilum and measured 9 x 7 mm.  A biopsy was recommended.  She underwent an ultrasound-guided biopsy of the right axillary lymph node on 12/19/2022.  A marker clip was deployed into the biopsy cavity.  The pathology ([id number]) revealed low-grade follicular lymphoma.  The normal lymph node architecture is effaced by nodular infiltrate of small mature lymphocytes with irregular nuclear contours.  Centroblasts were measured at less than 5 per high-power field except for one focal area showing 13 per high-powered field.  The lymphocytes are CD20, BCL6, and CD10 positive with strong Bcl-2 expression.  Cyclin D1 is negative.  The Ki-67 revealed an increase to 15-20% with the remaining cores under 5% for an overall average of less than 10%.  The lymphoma was graded at 1/3 with focal grade 2.  On flow cytometry the lymphocytes are lambda restricted and CD10 positive.      Past Medical History:  Diagnosis Date   Anxiety    No meds   Chronic kidney disease 2014   kidney stones   History of kidney stones    passed stone - no surgery required   PCOS (polycystic ovarian syndrome)    Pneumonia    PONV (postoperative nausea and vomiting)    SVD (spontaneous vaginal delivery)    x 1    .  G6 P1, 5 miscarriages   .  Abnormal Pap smear-colposcopy   .  History of a colon polyp-adenoma                                                                                    2019  Past Surgical History:  Procedure Laterality Date   cervical disk      C5-C6 replacement   DILATION AND CURETTAGE OF UTERUS  2011   missed   DILATION AND EVACUATION  09/27/2011   Procedure: DILATATION AND EVACUATION;  Surgeon: Cyril Mourning, MD;  Location: [facility];  Service: Gynecology;  Laterality: N/A;  with Ultrasound Guidance and Chromosomal Studies   DILATION AND EVACUATION N/A 12/13/2017   Procedure: DILATATION AND EVACUATION with ultrasound guidance;  Surgeon: Dian Queen, MD;  Location: [facility];  Service: Gynecology;  Laterality: N/A;   ROTATOR CUFF REPAIR Left    TONSILLECTOMY AND ADENOIDECTOMY      Medications: Reviewed  Allergies:  Allergies  Allergen Reactions   Augmentin [Amoxicillin-Pot Clavulanate] Hives and Other (See Comments)    Pt tolerates plain amoxicillin/penicillin with no problems    Family history: Her mother stage IV colon cancer at age 40, her paternal grandfather died of esophagus cancer  Social History:   She lives with her husband and stroke scale.  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  She does not use cigarettes.  She reports occasional alcohol use.  No transfusion history.  No risk factor for HIV or hepatitis.  ROS:   Positives include: Night sweats 3-4 nights per week for the past year, fullness in the left axilla, palpable lymph node in the right groin, fatigue and chest heaviness for 3 to 4 months  A complete ROS was otherwise negative.  Physical Exam:  Blood pressure 127/75, pulse 98, temperature 98.2 F (36.8 C), temperature source Oral, resp. rate 18, height 5\' 3"  (1.6 m), weight 145 lb (65.8 kg), SpO2 98 %.  HEENT: Oropharynx without visible mass, neck without mass Lungs: Clear bilaterally Cardiac: Regular rate and rhythm Abdomen: No hepatosplenomegaly  Vascular: Leg edema Lymph nodes: 1 cm right posterior cervical node (chronic per patient), "shotty "bilateral axillary nodes measuring less than 1 cm, no discrete  inguinal nodes Neurologic: Alert and oriented, the motor exam appears intact in the upper and lower extremities bilaterally Skin: No rash Musculoskeletal: No spine tenderness   LAB:  CBC  Lab Results  Component Value Date   WBC 6.6 01/01/2023   HGB 14.0 01/01/2023   HCT 42.1 01/01/2023   MCV 90.9 01/01/2023   PLT 201 01/01/2023   NEUTROABS 4.7 01/01/2023        CMP  Lab Results  Component Value Date   NA 137 01/01/2023   K 3.8 01/01/2023   CL 102 01/01/2023   CO2 27 01/01/2023   GLUCOSE 98 01/01/2023   BUN 10 01/01/2023   CREATININE 0.72 01/01/2023   CALCIUM 10.1 01/01/2023   PROT 7.8 01/01/2023   ALBUMIN 5.1 (H) 01/01/2023   AST 15 01/01/2023   ALT 10 01/01/2023   ALKPHOS 73 01/01/2023   BILITOT 0.4 01/01/2023   GFRNONAA >60 01/01/2023   GFRAA >90 06/15/2013        Assessment/Plan:   Follicular lymphoma-low-grade Bilateral screening mammogram 11/24/2022-possible adenopathy in the right and left axilla Bilateral axillary ultrasound 08/29/2023-mildly prominent lymph nodes, right axillary node without a defined preserved fatty hilum Ultrasound-guided biopsy of right axillary lymph node 12/19/2022-follicular lymphoma, overall grade 1 of 3, focal grade 2, Ki-67 less than 10% with areas of under 5% and areas at 15-20%, CD20, BCL6, CD10 positive.  Bcl-2 positive.  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 PCOS Ultiva miscarriages History of a colon polyp-tubular adenoma Family history of colon and esophagus cancer   Disposition:   [name] has been diagnosed with non-Hodgkin's lymphoma involving a left axillary lymph node.  Physical examination reveals small bilateral axillary lymph nodes and a chronically enlarged right posterior cervical node.  She reports intermittent night sweats for the past year and recent anterior chest heaviness.  No other apparent symptoms related to lymphoma.  We discussed the diagnosis of follicular lymphoma, the  prognosis, and treatment options.  She will complete a staging evaluation and return for an office visit to decide on observation versus treatment.  She will return to the lab today for a CBC, chemistry panel, LDH, and immunoglobulin levels.  She will be referred for a staging PET scan.  We will discuss observation versus systemic therapy based on the staging evaluation and her symptoms.  Betsy Coder, MD  01/01/2023, 4:53 PM

## 2023-01-02 ENCOUNTER — Inpatient Hospital Stay: Payer: BC Managed Care – PPO | Admitting: Licensed Clinical Social Worker

## 2023-01-02 ENCOUNTER — Telehealth: Payer: Self-pay | Admitting: *Deleted

## 2023-01-02 LAB — BETA 2 MICROGLOBULIN, SERUM: Beta-2 Microglobulin: 0.9 mg/L (ref 0.6–2.4)

## 2023-01-02 NOTE — Telephone Encounter (Signed)
Notified Adrienne Morgan that CBC and LDH are normal, which is good prognostic indicator for her low-grade lymphoma. F/U as scheduled.

## 2023-01-02 NOTE — Telephone Encounter (Signed)
-----   Message from Ladell Pier, MD sent at 01/01/2023  4:37 PM EDT ----- Please call patient, LDH and CBC are normal-consistent with low-grade lymphoma, good prognostic sign

## 2023-01-02 NOTE — Progress Notes (Signed)
[facility] Work  Clinical Social Work was referred by Art therapist for assessment of psychosocial needs.  Clinical Social Worker contacted patient by phone to offer support and assess for needs.    Due to technical difficulties, the call was shortened.  She is adjusting to her cancer diagnosis.  Patient expressed having an excellent support system, including, family, friends and her church.  She feels encouraged after speaking with Dr. Benay Spice.  She agreed to have CSW mail her [employer]     Margaree Mackintosh, LCSW  Clinical Social Worker [facility]

## 2023-01-04 ENCOUNTER — Encounter (HOSPITAL_COMMUNITY)
Admission: RE | Admit: 2023-01-04 | Discharge: 2023-01-04 | Disposition: A | Discharge status: Home / Self Care | Payer: BC Managed Care – PPO | Source: Ambulatory Visit | Attending: Oncology | Admitting: Oncology

## 2023-01-04 DIAGNOSIS — C8204 Follicular lymphoma grade I, lymph nodes of axilla and upper limb: Secondary | ICD-10-CM | POA: Diagnosis not present

## 2023-01-04 DIAGNOSIS — C8590 Non-Hodgkin lymphoma, unspecified, unspecified site: Secondary | ICD-10-CM | POA: Diagnosis not present

## 2023-01-04 LAB — GLUCOSE, CAPILLARY: Glucose-Capillary: 97 mg/dL (ref 70–99)

## 2023-01-04 MED ORDER — FLUDEOXYGLUCOSE F - 18 (FDG) INJECTION
7.5000 | Freq: Once | INTRAVENOUS | Status: AC
  Administered 2023-01-04: 7.27 via INTRAVENOUS

## 2023-01-05 ENCOUNTER — Encounter: Payer: Self-pay | Admitting: *Deleted

## 2023-01-05 LAB — MULTIPLE MYELOMA PANEL, SERUM
Albumin SerPl Elph-Mcnc: 4.5 g/dL — ABNORMAL HIGH (ref 2.9–4.4)
Albumin/Glob SerPl: 1.7 (ref 0.7–1.7)
Alpha 1: 0.2 g/dL (ref 0.0–0.4)
Alpha2 Glob SerPl Elph-Mcnc: 0.6 g/dL (ref 0.4–1.0)
B-Globulin SerPl Elph-Mcnc: 0.9 g/dL (ref 0.7–1.3)
Gamma Glob SerPl Elph-Mcnc: 1 g/dL (ref 0.4–1.8)
Globulin, Total: 2.7 g/dL (ref 2.2–3.9)
IgA: 81 mg/dL — ABNORMAL LOW (ref 87–352)
IgG (Immunoglobin G), Serum: 1079 mg/dL (ref 586–1602)
IgM (Immunoglobulin M), Srm: 139 mg/dL (ref 26–217)
Total Protein ELP: 7.2 g/dL (ref 6.0–8.5)

## 2023-01-05 NOTE — Progress Notes (Signed)
PATIENT NAVIGATOR PROGRESS NOTE  Name: Adrienne Morgan Date: 01/05/2023 MRN: [id number]  DOB: [birth year]   Reason for visit:  Scan review  Comments:  Called and spoke with [name] regarding PET scan results. Although lymph nodes are mild to moderate uptake her lab work (LDH, Beta microglobulin) is normal it does not necessarily indicate treatment at this time. She has an appt with Dr Benay Spice on 01/08/23 to discuss in detail and plan of care will be discussed    Time spent counseling/coordinating care: 30-45 minutes

## 2023-01-08 ENCOUNTER — Encounter: Payer: Self-pay | Admitting: *Deleted

## 2023-01-08 ENCOUNTER — Encounter: Payer: Self-pay | Admitting: Oncology

## 2023-01-08 ENCOUNTER — Inpatient Hospital Stay: Payer: BC Managed Care – PPO | Attending: Oncology | Admitting: Oncology

## 2023-01-08 VITALS — BP 120/70 | HR 91 | Temp 98.2°F | Resp 18 | Ht 63.0 in | Wt 145.0 lb

## 2023-01-08 DIAGNOSIS — C8299 Follicular lymphoma, unspecified, extranodal and solid organ sites: Secondary | ICD-10-CM

## 2023-01-08 DIAGNOSIS — Z800 Family history of malignant neoplasm of digestive organs: Secondary | ICD-10-CM | POA: Diagnosis not present

## 2023-01-08 DIAGNOSIS — E282 Polycystic ovarian syndrome: Secondary | ICD-10-CM | POA: Diagnosis not present

## 2023-01-08 DIAGNOSIS — Z86010 Personal history of colonic polyps: Secondary | ICD-10-CM | POA: Diagnosis not present

## 2023-01-08 DIAGNOSIS — Z5111 Encounter for antineoplastic chemotherapy: Secondary | ICD-10-CM | POA: Diagnosis not present

## 2023-01-08 DIAGNOSIS — Z79899 Other long term (current) drug therapy: Secondary | ICD-10-CM | POA: Insufficient documentation

## 2023-01-08 DIAGNOSIS — C8204 Follicular lymphoma grade I, lymph nodes of axilla and upper limb: Secondary | ICD-10-CM | POA: Insufficient documentation

## 2023-01-08 NOTE — Progress Notes (Signed)
  [facility] OFFICE PROGRESS NOTE   Diagnosis: Non-Hodgkin's lymphoma  INTERVAL HISTORY:   Adrienne Morgan returns as scheduled.  She has wrenching night sweats at least 4 nights per week.  No fever.  Good appetite.  She is working.  No change in the palpable lymph nodes.  No other complaint.  Objective:  Vital signs in last 24 hours:  Blood pressure 120/70, pulse 91, temperature 98.2 F (36.8 C), temperature source Oral, resp. rate 18, height 5\' 3"  (1.6 m), weight 145 lb (65.8 kg), SpO2 100 %.     Lymphatics: 1 cm or less bilateral axillary nodes, 1 cm right inguinal node, no cervical or supraclavicular nodes Resp: Clear bilaterally Cardio: Regular rate and rhythm GI: No hepatosplenomegaly Vascular: No leg edema   Lab Results:  Lab Results  Component Value Date   WBC 6.6 01/01/2023   HGB 14.0 01/01/2023   HCT 42.1 01/01/2023   MCV 90.9 01/01/2023   PLT 201 01/01/2023   NEUTROABS 4.7 01/01/2023    CMP  Lab Results  Component Value Date   NA 137 01/01/2023   K 3.8 01/01/2023   CL 102 01/01/2023   CO2 27 01/01/2023   GLUCOSE 98 01/01/2023   BUN 10 01/01/2023   CREATININE 0.72 01/01/2023   CALCIUM 10.1 01/01/2023   PROT 7.8 01/01/2023   ALBUMIN 5.1 (H) 01/01/2023   AST 15 01/01/2023   ALT 10 01/01/2023   ALKPHOS 73 01/01/2023   BILITOT 0.4 01/01/2023   GFRNONAA >60 01/01/2023   GFRAA >90 06/15/2013     Medications: I have reviewed the patient's current medications.   Assessment/Plan: Follicular lymphoma-low-grade, clinical stage III, FLIPI-intermediate risk Bilateral axillary ultrasound 08/29/2023-mildly prominent lymph nodes, right axillary node without a defined preserved fatty hilum Ultrasound-guided biopsy of right axillary lymph node 12/19/2022-follicular lymphoma, overall grade 1 of 3, focal grade 2, Ki-67 less than 10% with areas of under 5% and areas at 15-20%, CD20, BCL6, CD10 positive.  Bcl-2 positive.  Flow cytometry-lambda restricted  CD10 positive B-cell lymphoma PET 2024-mild to moderate FDG activity associated with borderline enlarged bilateral axillary, retroperitoneal, mesenteric, right inguinal, and right external iliac nodes, mild diffuse increased uptake in the spleen PCOS Ultiva miscarriages History of a colon polyp-tubular adenoma Family history of colon and esophagus cancer    Disposition: Adrienne Morgan has been diagnosed with advanced stage follicular lymphoma.  She has intermediate risk disease based on the FLIPI index.  She is symptomatic with night sweats.  We discussed treatment options including observation, rituximab, and Bendamustine/rituximab.  She would like to proceed with treatment given her symptoms.  I recommend Bendamustine/rituximab versus single agent rituximab.  She will be referred to the lymphoma service at [facility] for a second opinion regarding treatment options and for a pathology review.  She will be scheduled to begin systemic therapy on 01/24/2023.  We will adjust this plan based on the consultation at [facility].  We reviewed potential toxicities associated with rituximab including the chance of allergic reaction, infection, CNS toxicity, pneumonitis, and hematologic toxicity.  Betsy Coder, MD  01/08/2023  5:26 PM

## 2023-01-08 NOTE — Progress Notes (Signed)
Health Care Provider Certification faxed and picked up at clinic per patient's request.  Confirmation received.   Fax no. [phone]

## 2023-01-08 NOTE — Progress Notes (Signed)
START ON PATHWAY REGIMEN - Lymphoma and CLL     A cycle is every 7 days:     Rituximab-xxxx   **Always confirm dose/schedule in your pharmacy ordering system**  Patient Characteristics: Follicular Lymphoma, Grades 1, 2, and 3A, First Line, Stage III / IV, Symptomatic or Bulky Disease Disease Type: Follicular Lymphoma, Grade 1, 2, or 3A Disease Type: Not Applicable Disease Type: Not Applicable Line of Therapy: First Line Disease Characteristics: Symptomatic or Bulky Disease Intent of Therapy: Non-Curative / Palliative Intent, Discussed with Patient

## 2023-01-08 NOTE — Progress Notes (Signed)
PATIENT NAVIGATOR PROGRESS NOTE  Name: Adrienne Morgan Date: 01/08/2023 MRN: [id number]  DOB: [birth year]   Reason for visit: F/U visit   Comments:    Met with [name] during appt with Dr Benay Spice After discussion with Dr Benay Spice pt given information on Rituxan Urgent referral faxed to Dr Mindi Slicker at [facility] fax number [phone] Pt will attend pt education on 01/15/23 Encouraged to call with any questions or issues   Time spent counseling/coordinating care: > 60 minutes

## 2023-01-09 ENCOUNTER — Other Ambulatory Visit: Payer: Self-pay

## 2023-01-10 NOTE — Progress Notes (Signed)
Pharmacist Chemotherapy Monitoring - Initial Assessment    Anticipated start date: 01/24/2023   The following has been reviewed per standard work regarding the patient's treatment regimen: The patient's diagnosis, treatment plan and drug doses, and organ/hematologic function Lab orders and baseline tests specific to treatment regimen  The treatment plan start date, drug sequencing, and pre-medications Prior authorization status  Patient's documented medication list, including drug-drug interaction screen and prescriptions for anti-emetics and supportive care specific to the treatment regimen The drug concentrations, fluid compatibility, administration routes, and timing of the medications to be used The patient's access for treatment and lifetime cumulative dose history, if applicable  The patient's medication allergies and previous infusion related reactions, if applicable   Changes made to treatment plan:  N/A  Follow up needed:  Hep B labs drawn prior to tx   Philomena Course, [facility], 01/10/2023  12:39 PM

## 2023-01-12 ENCOUNTER — Other Ambulatory Visit: Payer: Self-pay | Admitting: *Deleted

## 2023-01-12 DIAGNOSIS — C8204 Follicular lymphoma grade I, lymph nodes of axilla and upper limb: Secondary | ICD-10-CM

## 2023-01-15 ENCOUNTER — Inpatient Hospital Stay: Payer: BC Managed Care – PPO

## 2023-01-15 ENCOUNTER — Other Ambulatory Visit: Payer: Self-pay | Admitting: *Deleted

## 2023-01-15 ENCOUNTER — Encounter: Payer: Self-pay | Admitting: *Deleted

## 2023-01-15 ENCOUNTER — Other Ambulatory Visit: Payer: Self-pay | Admitting: Oncology

## 2023-01-15 ENCOUNTER — Encounter: Payer: Self-pay | Admitting: Oncology

## 2023-01-15 MED ORDER — ONDANSETRON HCL 8 MG PO TABS: 8.0000 mg | ORAL_TABLET | Freq: Three times a day (TID) | ORAL | 0 refills | Status: DC | PRN

## 2023-01-15 MED ORDER — PROCHLORPERAZINE MALEATE 10 MG PO TABS: 10.0000 mg | ORAL_TABLET | Freq: Four times a day (QID) | ORAL | 0 refills | Status: DC | PRN

## 2023-01-15 NOTE — Progress Notes (Signed)
DISCONTINUE ON PATHWAY REGIMEN - Lymphoma and CLL     A cycle is every 7 days:     Rituximab-xxxx   **Always confirm dose/schedule in your pharmacy ordering system**  REASON: Other Reason PRIOR TREATMENT: LYOS201: Rituximab 375 mg/m2 IV Weekly x 4 Weeks TREATMENT RESPONSE: Unable to Evaluate  START ON PATHWAY REGIMEN - Lymphoma and CLL     A cycle is every 28 days:     Rituximab-xxxx      Bendamustine   **Always confirm dose/schedule in your pharmacy ordering system**  Patient Characteristics: Follicular Lymphoma, Grades 1, 2, and 3A, First Line, Stage III / IV, Symptomatic or Bulky Disease Disease Type: Follicular Lymphoma, Grade 1, 2, or 3A Disease Type: Not Applicable Disease Type: Not Applicable Line of Therapy: First Line Disease Characteristics: Symptomatic or Bulky Disease Intent of Therapy: Non-Curative / Palliative Intent, Discussed with Patient

## 2023-01-15 NOTE — Progress Notes (Signed)
PATIENT NAVIGATOR PROGRESS NOTE  Name: Adrienne Morgan Date: 01/15/2023 MRN: [id number]  DOB: 1976/03/19   Reason for visit: Patient education appt  Comments:  Met with [name] and had their patient education session    Time spent counseling/coordinating care: > 60 minutes

## 2023-01-19 ENCOUNTER — Other Ambulatory Visit: Payer: Self-pay

## 2023-01-21 ENCOUNTER — Other Ambulatory Visit: Payer: Self-pay | Admitting: Oncology

## 2023-01-21 DIAGNOSIS — C8299 Follicular lymphoma, unspecified, extranodal and solid organ sites: Secondary | ICD-10-CM

## 2023-01-23 DIAGNOSIS — R591 Generalized enlarged lymph nodes: Secondary | ICD-10-CM | POA: Diagnosis not present

## 2023-01-24 ENCOUNTER — Encounter: Payer: Self-pay | Admitting: Nurse Practitioner

## 2023-01-24 ENCOUNTER — Inpatient Hospital Stay: Payer: BC Managed Care – PPO

## 2023-01-24 ENCOUNTER — Inpatient Hospital Stay (HOSPITAL_BASED_OUTPATIENT_CLINIC_OR_DEPARTMENT_OTHER): Payer: BC Managed Care – PPO | Admitting: Nurse Practitioner

## 2023-01-24 VITALS — BP 95/56 | HR 83 | Temp 98.5°F | Resp 18

## 2023-01-24 VITALS — BP 113/69 | HR 74 | Temp 98.2°F | Resp 18 | Ht 63.0 in | Wt 144.8 lb

## 2023-01-24 DIAGNOSIS — E282 Polycystic ovarian syndrome: Secondary | ICD-10-CM | POA: Diagnosis not present

## 2023-01-24 DIAGNOSIS — C8299 Follicular lymphoma, unspecified, extranodal and solid organ sites: Secondary | ICD-10-CM

## 2023-01-24 DIAGNOSIS — Z800 Family history of malignant neoplasm of digestive organs: Secondary | ICD-10-CM | POA: Diagnosis not present

## 2023-01-24 DIAGNOSIS — C8204 Follicular lymphoma grade I, lymph nodes of axilla and upper limb: Secondary | ICD-10-CM

## 2023-01-24 DIAGNOSIS — Z86010 Personal history of colonic polyps: Secondary | ICD-10-CM | POA: Diagnosis not present

## 2023-01-24 DIAGNOSIS — Z5111 Encounter for antineoplastic chemotherapy: Secondary | ICD-10-CM | POA: Diagnosis not present

## 2023-01-24 DIAGNOSIS — Z79899 Other long term (current) drug therapy: Secondary | ICD-10-CM | POA: Diagnosis not present

## 2023-01-24 LAB — CBC WITH DIFFERENTIAL (CANCER CENTER ONLY)
Abs Immature Granulocytes: 0.01 10*3/uL (ref 0.00–0.07)
Basophils Absolute: 0 10*3/uL (ref 0.0–0.1)
Basophils Relative: 1 %
Eosinophils Absolute: 0.1 10*3/uL (ref 0.0–0.5)
Eosinophils Relative: 1 %
HCT: 38.3 % (ref 36.0–46.0)
Hemoglobin: 12.6 g/dL (ref 12.0–15.0)
Immature Granulocytes: 0 %
Lymphocytes Relative: 25 %
Lymphs Abs: 1.3 10*3/uL (ref 0.7–4.0)
MCH: 30.3 pg (ref 26.0–34.0)
MCHC: 32.9 g/dL (ref 30.0–36.0)
MCV: 92.1 fL (ref 80.0–100.0)
Monocytes Absolute: 0.3 10*3/uL (ref 0.1–1.0)
Monocytes Relative: 7 %
Neutro Abs: 3.5 10*3/uL (ref 1.7–7.7)
Neutrophils Relative %: 66 %
Platelet Count: 176 10*3/uL (ref 150–400)
RBC: 4.16 MIL/uL (ref 3.87–5.11)
RDW: 12.5 % (ref 11.5–15.5)
WBC Count: 5.3 10*3/uL (ref 4.0–10.5)
nRBC: 0 % (ref 0.0–0.2)

## 2023-01-24 LAB — CMP (CANCER CENTER ONLY)
ALT: 9 U/L (ref 0–44)
AST: 14 U/L — ABNORMAL LOW (ref 15–41)
Albumin: 4.4 g/dL (ref 3.5–5.0)
Alkaline Phosphatase: 75 U/L (ref 38–126)
Anion gap: 6 (ref 5–15)
BUN: 10 mg/dL (ref 6–20)
CO2: 26 mmol/L (ref 22–32)
Calcium: 9.5 mg/dL (ref 8.9–10.3)
Chloride: 106 mmol/L (ref 98–111)
Creatinine: 0.68 mg/dL (ref 0.44–1.00)
GFR, Estimated: >60 mL/min (ref >60)
Glucose, Bld: 82 mg/dL (ref 70–99)
Potassium: 3.6 mmol/L (ref 3.5–5.1)
Sodium: 138 mmol/L (ref 135–145)
Total Bilirubin: 0.5 mg/dL (ref 0.3–1.2)
Total Protein: 6.8 g/dL (ref 6.5–8.1)

## 2023-01-24 LAB — PREGNANCY, URINE: Preg Test, Ur: NEGATIVE

## 2023-01-24 LAB — HEPATITIS B SURFACE ANTIGEN: Hepatitis B Surface Ag: NONREACTIVE

## 2023-01-24 LAB — HEPATITIS B CORE ANTIBODY, TOTAL: Hep B Core Total Ab: NONREACTIVE

## 2023-01-24 MED ORDER — DIPHENHYDRAMINE HCL 25 MG PO CAPS
50.0000 mg | ORAL_CAPSULE | Freq: Once | ORAL | Status: AC
  Administered 2023-01-24: 50 mg via ORAL
  Filled 2023-01-24: qty 2

## 2023-01-24 MED ORDER — PALONOSETRON HCL INJECTION 0.25 MG/5ML
0.2500 mg | Freq: Once | INTRAVENOUS | Status: AC
  Administered 2023-01-24: 0.25 mg via INTRAVENOUS
  Filled 2023-01-24: qty 5

## 2023-01-24 MED ORDER — SODIUM CHLORIDE 0.9 % IV SOLN: Freq: Once | INTRAVENOUS | Status: AC

## 2023-01-24 MED ORDER — SODIUM CHLORIDE 0.9 % IV SOLN
375.0000 mg/m2 | Freq: Once | INTRAVENOUS | Status: AC
  Administered 2023-01-24: 600 mg via INTRAVENOUS
  Filled 2023-01-24: qty 50

## 2023-01-24 MED ORDER — ACETAMINOPHEN 325 MG PO TABS
650.0000 mg | ORAL_TABLET | Freq: Once | ORAL | Status: AC
  Administered 2023-01-24: 650 mg via ORAL
  Filled 2023-01-24: qty 2

## 2023-01-24 MED ORDER — SODIUM CHLORIDE 0.9 % IV SOLN
10.0000 mg | Freq: Once | INTRAVENOUS | Status: AC
  Administered 2023-01-24: 10 mg via INTRAVENOUS
  Filled 2023-01-24: qty 10

## 2023-01-24 MED ORDER — SODIUM CHLORIDE 0.9 % IV SOLN
90.0000 mg/m2 | Freq: Once | INTRAVENOUS | Status: AC
  Administered 2023-01-24: 155 mg via INTRAVENOUS
  Filled 2023-01-24: qty 6.2

## 2023-01-24 NOTE — Progress Notes (Signed)
Bendamustine diluted in NS83mL, Lot:  [id number], Exp: 05/08/24  Richardean Sale, RPH, BCPS, BCOP 01/24/2023 1:37 PM

## 2023-01-24 NOTE — Patient Instructions (Signed)
[facility]   Discharge Instructions: Thank you for choosing [facility] to provide your oncology and hematology care.   If you have a lab appointment with the Cancer Center, please go directly to the Cancer Center and check in at the registration area.   Wear comfortable clothing and clothing appropriate for easy access to any Portacath or PICC line.   We strive to give you quality time with your provider. You may need to reschedule your appointment if you arrive late (15 or more minutes).  Arriving late affects you and other patients whose appointments are after yours.  Also, if you miss three or more appointments without notifying the office, you may be dismissed from the clinic at the provider's discretion.      For prescription refill requests, have your pharmacy contact our office and allow 72 hours for refills to be completed.    Today you received the following chemotherapy and/or immunotherapy agents Rituximab-pvvr, Bendeka      To help prevent nausea and vomiting after your treatment, we encourage you to take your nausea medication as directed.  BELOW ARE SYMPTOMS THAT SHOULD BE REPORTED IMMEDIATELY: *FEVER GREATER THAN 100.4 F (38 C) OR HIGHER *CHILLS OR SWEATING *NAUSEA AND VOMITING THAT IS NOT CONTROLLED WITH YOUR NAUSEA MEDICATION *UNUSUAL SHORTNESS OF BREATH *UNUSUAL BRUISING OR BLEEDING *URINARY PROBLEMS (pain or burning when urinating, or frequent urination) *BOWEL PROBLEMS (unusual diarrhea, constipation, pain near the anus) TENDERNESS IN MOUTH AND THROAT WITH OR WITHOUT PRESENCE OF ULCERS (sore throat, sores in mouth, or a toothache) UNUSUAL RASH, SWELLING OR PAIN  UNUSUAL VAGINAL DISCHARGE OR ITCHING   Items with * indicate a potential emergency and should be followed up as soon as possible or go to the Emergency Department if any problems should occur.  Please show the CHEMOTHERAPY ALERT CARD or IMMUNOTHERAPY ALERT  CARD at check-in to the Emergency Department and triage nurse.  Should you have questions after your visit or need to cancel or reschedule your appointment, please contact [facility]  Dept: [phone]  and follow the prompts.  Office hours are 8:00 a.m. to 4:30 p.m. Monday - Friday. Please note that voicemails left after 4:00 p.m. may not be returned until the following business day.  We are closed weekends and major holidays. You have access to a nurse at all times for urgent questions. Please call the main number to the clinic Dept: [phone] and follow the prompts.   For any non-urgent questions, you may also contact your provider using MyChart. We now offer e-Visits for anyone 39 and older to request care online for non-urgent symptoms. For details visit [email or web address].   Also download the MyChart app! Go to the app store, search "MyChart", open the app, select [facility], and log in with your MyChart username and password.  Rituximab Injection What is this medication? RITUXIMAB (ri TUX i mab) treats leukemia and lymphoma. It works by blocking a protein that causes cancer cells to grow and multiply. This helps to slow or stop the spread of cancer cells. It may also be used to treat autoimmune conditions, such as arthritis. It works by slowing down an overactive immune system. It is a monoclonal antibody. This medicine may be used for other purposes; ask your health care provider or pharmacist if you have questions. COMMON BRAND NAME(S): RIABNI, Rituxan, RUXIENCE, truxima What should I tell my care team before I take this medication? They need  to know if you have any of these conditions: Chest pain Heart disease Immune system problems Infection, such as chickenpox, cold sores, hepatitis B, herpes Irregular heartbeat or rhythm Kidney disease Low blood counts, such as low white cells, platelets, red cells Lung disease Recent or upcoming  vaccine An unusual or allergic reaction to rituximab, other medications, foods, dyes, or preservatives Pregnant or trying to get pregnant Breast-feeding How should I use this medication? This medication is injected into a vein. It is given by a care team in a hospital or clinic setting. A special MedGuide will be given to you before each treatment. Be sure to read this information carefully each time. Talk to your care team about the use of this medication in children. While this medication may be prescribed for children as young as 6 months for selected conditions, precautions do apply. Overdosage: If you think you have taken too much of this medicine contact a poison control center or emergency room at once. NOTE: This medicine is only for you. Do not share this medicine with others. What if I miss a dose? Keep appointments for follow-up doses. It is important not to miss your dose. Call your care team if you are unable to keep an appointment. What may interact with this medication? Do not take this medication with any of the following: Live vaccines This medication may also interact with the following: Cisplatin This list may not describe all possible interactions. Give your health care provider a list of all the medicines, herbs, non-prescription drugs, or dietary supplements you use. Also tell them if you smoke, drink alcohol, or use illegal drugs. Some items may interact with your medicine. What should I watch for while using this medication? Your condition will be monitored carefully while you are receiving this medication. You may need blood work while taking this medication. This medication can cause serious infusion reactions. To reduce the risk your care team may give you other medications to take before receiving this one. Be sure to follow the directions from your care team. This medication may increase your risk of getting an infection. Call your care team for advice if you get a  fever, chills, sore throat, or other symptoms of a cold or flu. Do not treat yourself. Try to avoid being around people who are sick. Call your care team if you are around anyone with measles, chickenpox, or if you develop sores or blisters that do not heal properly. Avoid taking medications that contain aspirin, acetaminophen, ibuprofen, naproxen, or ketoprofen unless instructed by your care team. These medications may hide a fever. This medication may cause serious skin reactions. They can happen weeks to months after starting the medication. Contact your care team right away if you notice fevers or flu-like symptoms with a rash. The rash may be red or purple and then turn into blisters or peeling of the skin. You may also notice a red rash with swelling of the face, lips, or lymph nodes in your neck or under your arms. In some patients, this medication may cause a serious brain infection that may cause death. If you have any problems seeing, thinking, speaking, walking, or standing, tell your care team right away. If you cannot reach your care team, urgently seek another source of medical care. Talk to your care team if you may be pregnant. Serious birth defects can occur if you take this medication during pregnancy and for 12 months after the last dose. You will need a negative pregnancy   test before starting this medication. Contraception is recommended while taking this medication and for 12 months after the last dose. Your care team can help you find the option that works for you. Do not breastfeed while taking this medication and for at least 6 months after the last dose. What side effects may I notice from receiving this medication? Side effects that you should report to your care team as soon as possible: Allergic reactions or angioedema--skin rash, itching or hives, swelling of the face, eyes, lips, tongue, arms, or legs, trouble swallowing or breathing Bowel blockage--stomach cramping, unable to  have a bowel movement or pass gas, loss of appetite, vomiting Dizziness, loss of balance or coordination, confusion or trouble speaking Heart attack--pain or tightness in the chest, shoulders, arms, or jaw, nausea, shortness of breath, cold or clammy skin, feeling faint or lightheaded Heart rhythm changes--fast or irregular heartbeat, dizziness, feeling faint or lightheaded, chest pain, trouble breathing Infection--fever, chills, cough, sore throat, wounds that don't heal, pain or trouble when passing urine, general feeling of discomfort or being unwell Infusion reactions--chest pain, shortness of breath or trouble breathing, feeling faint or lightheaded Kidney injury--decrease in the amount of urine, swelling of the ankles, hands, or feet Liver injury--right upper belly pain, loss of appetite, nausea, light-colored stool, dark yellow or brown urine, yellowing skin or eyes, unusual weakness or fatigue Redness, blistering, peeling, or loosening of the skin, including inside the mouth Stomach pain that is severe, does not go away, or gets worse Tumor lysis syndrome (TLS)--nausea, vomiting, diarrhea, decrease in the amount of urine, dark urine, unusual weakness or fatigue, confusion, muscle pain or cramps, fast or irregular heartbeat, joint pain Side effects that usually do not require medical attention (report to your care team if they continue or are bothersome): Headache Joint pain Nausea Runny or stuffy nose Unusual weakness or fatigue This list may not describe all possible side effects. Call your doctor for medical advice about side effects. You may report side effects to FDA at 1-800-FDA-1088. Where should I keep my medication? This medication is given in a hospital or clinic. It will not be stored at home. NOTE: This sheet is a summary. It may not cover all possible information. If you have questions about this medicine, talk to your doctor, pharmacist, or health care provider.  2023  Elsevier/Gold Standard (2022-02-07 00:00:00) Bendamustine Injection What is this medication? BENDAMUSTINE (BEN da MUS teen) treats leukemia and lymphoma. It works by slowing down the growth of cancer cells. This medicine may be used for other purposes; ask your health care provider or pharmacist if you have questions. COMMON BRAND NAME(S): Marilynne Halsted, VIVIMUSTA What should I tell my care team before I take this medication? They need to know if you have any of these conditions: Infection, especially a viral infection, such as chickenpox, cold sores, herpes Kidney disease Liver disease An unusual or allergic reaction to bendamustine, mannitol, other medications, foods, dyes, or preservatives Pregnant or trying to get pregnant Breast-feeding How should I use this medication? This medication is injected into a vein. It is given by your care team in a hospital or clinic setting. Talk to your care team about the use of this medication in children. Special care may be needed. Overdosage: If you think you have taken too much of this medicine contact a poison control center or emergency room at once. NOTE: This medicine is only for you. Do not share this medicine with others. What if I miss a dose?  Keep appointments for follow-up doses. It is important not to miss your dose. Call your care team if you are unable to keep an appointment. What may interact with this medication? Do not take this medication with any of the following: Clozapine This medication may also interact with the following: Atazanavir Cimetidine Ciprofloxacin Enoxacin Fluvoxamine Medications for seizures, such as carbamazepine, phenobarbital Mexiletine Rifampin Tacrine Thiabendazole Zileuton This list may not describe all possible interactions. Give your health care provider a list of all the medicines, herbs, non-prescription drugs, or dietary supplements you use. Also tell them if you smoke, drink  alcohol, or use illegal drugs. Some items may interact with your medicine. What should I watch for while using this medication? Visit your care team for regular checks on your progress. This medication may make you feel generally unwell. This is not uncommon, as chemotherapy can affect healthy cells as well as cancer cells. Report any side effects. Continue your course of treatment even though you feel ill unless your care team tells you to stop. You may need blood work while taking this medication. This medication may increase your risk of getting an infection. Call your care team for advice if you get a fever, chills, sore throat, or other symptoms of a cold or flu. Do not treat yourself. Try to avoid being around people who are sick. This medication may cause serious skin reactions. They can happen weeks to months after starting the medication. Contact your care team right away if you notice fevers or flu-like symptoms with a rash. The rash may be red or purple and then turn into blisters or peeling of the skin. You may also notice a red rash with swelling of the face, lips, or lymph nodes in your neck or under your arms. In some patients, this medication may cause a serious brain infection that may cause death. If you have any problems seeing, thinking, speaking, walking, or standing, tell your care team right away. If you cannot reach your care team, urgently seek other source of medical care. This medication may increase your risk to bruise or bleed. Call your care team if you notice any unusual bleeding. Talk to your care team about your risk of cancer. You may be more at risk for certain types of cancer if you take this medication. Talk to your care team about your risk of skin cancer. You may be more at risk for skin cancer if you take this medication. Talk to your care team if you or your partner wish to become pregnant or think either of you might be pregnant. This medication can cause serious  birth defects if taken during pregnancy or for up to 6 months after the last dose. A negative pregnancy test is required before starting this medication. A reliable form of contraception is recommended while taking this medication and for 6 months after the last dose. Talk to your care team about reliable forms of contraception. Wear a condom while taking this medication and for at least 3 months after the last dose. Do not breast-feed while taking this medication or for at least 1 week after the last dose. This medication may cause infertility. Talk to your care team if you are concerned about your fertility. What side effects may I notice from receiving this medication? Side effects that you should report to your care team as soon as possible: Allergic reactions--skin rash, itching, hives, swelling of the face, lips, tongue, or throat Infection--fever, chills, cough, sore throat, wounds that  don't heal, pain or trouble when passing urine, general feeling of discomfort or being unwell Infusion reactions--chest pain, shortness of breath or trouble breathing, feeling faint or lightheaded Liver injury--right upper belly pain, loss of appetite, nausea, light-colored stool, dark yellow or brown urine, yellowing skin or eyes, unusual weakness or fatigue Low red blood cell level--unusual weakness or fatigue, dizziness, headache, trouble breathing Painful swelling, warmth, or redness of the skin, blisters or sores at the infusion site Rash, fever, and swollen lymph nodes Redness, blistering, peeling, or loosening of the skin, including inside the mouth Tumor lysis syndrome (TLS)--nausea, vomiting, diarrhea, decrease in the amount of urine, dark urine, unusual weakness or fatigue, confusion, muscle pain or cramps, fast or irregular heartbeat, joint pain Unusual bruising or bleeding Side effects that usually do not require medical attention (report to your care team if they continue or are  bothersome): Diarrhea Fatigue Headache Loss of appetite Nausea Vomiting This list may not describe all possible side effects. Call your doctor for medical advice about side effects. You may report side effects to FDA at 1-800-FDA-1088. Where should I keep my medication? This medication is given in a hospital or clinic. It will not be stored at home. NOTE: This sheet is a summary. It may not cover all possible information. If you have questions about this medicine, talk to your doctor, pharmacist, or health care provider.  2023 Elsevier/Gold Standard (2022-01-02 00:00:00)

## 2023-01-24 NOTE — Progress Notes (Signed)
[name], NP notified that pt temperature has increased to 99.2 from 98.3.

## 2023-01-24 NOTE — Progress Notes (Signed)
Pt temperature slowly increasing over treatment time.  At 1335 this RN assessed patient and patient complained of a small tickle in the back of her throat and patient noted that she felt a little warm. No other symptoms.  Rituximab infusion was paused and [name], NP notified and updated with vital signs.  Pt symptoms resolved after a 10 minute interruption in infusion.  [name], NP at bedside assessing patient. Per Roxan Diesel, NP restart infusion of Rituximab at the last tolerated rate/dose.

## 2023-01-24 NOTE — Progress Notes (Unsigned)
Patient seen by Lisa Thomas NP today  Vitals are within treatment parameters.  Labs reviewed by Lisa Thomas NP and are within treatment parameters.  Per physician team, patient is ready for treatment and there are NO modifications to the treatment plan.     

## 2023-01-24 NOTE — Progress Notes (Unsigned)
  [facility] OFFICE PROGRESS NOTE   Diagnosis: Non-Hodgkin's lymphoma  INTERVAL HISTORY:   [facility] returns as scheduled.  She continues to have periodic drenching night sweats.  She notes fullness in the bilateral axilla, palpable lymph node right inguinal region.  She has a good appetite.  Objective:  Vital signs in last 24 hours:  Blood pressure 113/69, pulse 74, temperature 98.2 F (36.8 C), temperature source Oral, resp. rate 18, height  (1.6 m), weight 144 lb 12.8 oz (65.7 kg), SpO2 100 %.    Lymphatics: Small bilateral axillary nodes, approximate 1 cm right inguinal node.  No cervical or supraclavicular lymph nodes. Resp: Lungs clear bilaterally. Cardio: Regular rate and rhythm. GI: No hepatosplenomegaly. Vascular: No leg edema.   Lab Results:  Lab Results  Component Value Date   WBC 5.3 01/24/2023   HGB 12.6 01/24/2023   HCT 38.3 01/24/2023   MCV 92.1 01/24/2023   PLT 176 01/24/2023   NEUTROABS 3.5 01/24/2023    Imaging:  No results found.  Medications: I have reviewed the patient's current medications.  Assessment/Plan: Follicular lymphoma-low-grade, clinical stage III, FLIPI-intermediate risk Bilateral axillary ultrasound 08/29/2023-mildly prominent lymph nodes, right axillary node without a defined preserved fatty hilum Ultrasound-guided biopsy of right axillary lymph node 12/19/2022-follicular lymphoma, overall grade 1 of 3, focal grade 2, Ki-67 less than 10% with areas of under 5% and areas at 15-20%, CD20, BCL6, CD10 positive.  Bcl-2 positive.  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 PET 2024-mild to moderate FDG activity associated with borderline enlarged bilateral axillary, retroperitoneal, mesenteric, right inguinal, and right external iliac nodes, mild diffuse increased uptake in the spleen Cycle 1 Bendamustine/rituximab 01/24/2023 PCOS multiple miscarriages History of a colon polyp-tubular adenoma Family  history of colon and esophagus cancer    Disposition: [facility] appears stable.  She has advanced stage follicular lymphoma.  She is scheduled to begin treatment today with Bendamustine/Rituxan.  We again reviewed potential toxicities.  She agrees to proceed.  CBC and chemistry panel reviewed.  Labs adequate to proceed as above.  She has an appointment with Dr. Carla Drape at [facility] 02/13/2023.  Nadir CBC 02/06/2023.  We will see her in follow-up prior to cycle 2 on 02/21/2023.  She will contact the office in the interim with any problems.  Patient seen with Dr. Truett Perna.    [name] ANP/GNP-BC   01/24/2023  9:21 AM This was a shared visit with [name].  [facility] will complete cycle 1 Bendamustine/rituximab beginning today.  Hopefully the night sweats will improve over the next few weeks.  She is scheduled for a second opinion appointment at [facility] in early May.  Mancel Bale, MD

## 2023-01-25 ENCOUNTER — Encounter: Payer: Self-pay | Admitting: Oncology

## 2023-01-25 ENCOUNTER — Inpatient Hospital Stay: Payer: BC Managed Care – PPO

## 2023-01-25 VITALS — BP 99/64 | HR 73 | Temp 98.2°F | Resp 18

## 2023-01-25 DIAGNOSIS — C8299 Follicular lymphoma, unspecified, extranodal and solid organ sites: Secondary | ICD-10-CM

## 2023-01-25 DIAGNOSIS — C8204 Follicular lymphoma grade I, lymph nodes of axilla and upper limb: Secondary | ICD-10-CM | POA: Diagnosis not present

## 2023-01-25 DIAGNOSIS — Z5111 Encounter for antineoplastic chemotherapy: Secondary | ICD-10-CM | POA: Diagnosis not present

## 2023-01-25 DIAGNOSIS — Z86010 Personal history of colonic polyps: Secondary | ICD-10-CM | POA: Diagnosis not present

## 2023-01-25 DIAGNOSIS — Z800 Family history of malignant neoplasm of digestive organs: Secondary | ICD-10-CM | POA: Diagnosis not present

## 2023-01-25 DIAGNOSIS — E282 Polycystic ovarian syndrome: Secondary | ICD-10-CM | POA: Diagnosis not present

## 2023-01-25 DIAGNOSIS — Z79899 Other long term (current) drug therapy: Secondary | ICD-10-CM | POA: Diagnosis not present

## 2023-01-25 MED ORDER — SODIUM CHLORIDE 0.9 % IV SOLN
10.0000 mg | Freq: Once | INTRAVENOUS | Status: AC
  Administered 2023-01-25: 10 mg via INTRAVENOUS
  Filled 2023-01-25: qty 10

## 2023-01-25 MED ORDER — SODIUM CHLORIDE 0.9 % IV SOLN
90.0000 mg/m2 | Freq: Once | INTRAVENOUS | Status: AC
  Administered 2023-01-25: 155 mg via INTRAVENOUS
  Filled 2023-01-25: qty 6.2

## 2023-01-25 MED ORDER — SODIUM CHLORIDE 0.9 % IV SOLN: Freq: Once | INTRAVENOUS | Status: AC

## 2023-01-25 NOTE — Progress Notes (Signed)
24 Hour Callback 24 Hour Callback post 1st time Rituximab-pvvr, Bendeka.  Pt currently in the infusion room for Bendeka. Pt states that she tolerated her treatment yesterday.  Pt did note some abdominal bloating, mild stomach cramps and insomnia last evening. No other concerns or questions.

## 2023-01-25 NOTE — Patient Instructions (Signed)
[facility]  Discharge Instructions: Thank you for choosing [facility] to provide your oncology and hematology care.   If you have a lab appointment with the Cancer Center, please go directly to the Cancer Center and check in at the registration area.   Wear comfortable clothing and clothing appropriate for easy access to any Portacath or PICC line.   We strive to give you quality time with your provider. You may need to reschedule your appointment if you arrive late (15 or more minutes).  Arriving late affects you and other patients whose appointments are after yours.  Also, if you miss three or more appointments without notifying the office, you may be dismissed from the clinic at the provider's discretion.      For prescription refill requests, have your pharmacy contact our office and allow 72 hours for refills to be completed.    Today you received the following chemotherapy and/or immunotherapy agents: bendamustine      To help prevent nausea and vomiting after your treatment, we encourage you to take your nausea medication as directed.  BELOW ARE SYMPTOMS THAT SHOULD BE REPORTED IMMEDIATELY: *FEVER GREATER THAN 100.4 F (38 C) OR HIGHER *CHILLS OR SWEATING *NAUSEA AND VOMITING THAT IS NOT CONTROLLED WITH YOUR NAUSEA MEDICATION *UNUSUAL SHORTNESS OF BREATH *UNUSUAL BRUISING OR BLEEDING *URINARY PROBLEMS (pain or burning when urinating, or frequent urination) *BOWEL PROBLEMS (unusual diarrhea, constipation, pain near the anus) TENDERNESS IN MOUTH AND THROAT WITH OR WITHOUT PRESENCE OF ULCERS (sore throat, sores in mouth, or a toothache) UNUSUAL RASH, SWELLING OR PAIN  UNUSUAL VAGINAL DISCHARGE OR ITCHING   Items with * indicate a potential emergency and should be followed up as soon as possible or go to the Emergency Department if any problems should occur.  Please show the CHEMOTHERAPY ALERT CARD or IMMUNOTHERAPY ALERT CARD at  check-in to the Emergency Department and triage nurse.  Should you have questions after your visit or need to cancel or reschedule your appointment, please contact [facility]  Dept: [phone]  and follow the prompts.  Office hours are 8:00 a.m. to 4:30 p.m. Monday - Friday. Please note that voicemails left after 4:00 p.m. may not be returned until the following business day.  We are closed weekends and major holidays. You have access to a nurse at all times for urgent questions. Please call the main number to the clinic Dept: [phone] and follow the prompts.   For any non-urgent questions, you may also contact your provider using MyChart. We now offer e-Visits for anyone 18 and older to request care online for non-urgent symptoms. For details visit [email or web address].   Also download the MyChart app! Go to the app store, search "MyChart", open the app, select [facility], and log in with your MyChart username and password.  Bendamustine Injection What is this medication? BENDAMUSTINE (BEN da MUS teen) treats leukemia and lymphoma. It works by slowing down the growth of cancer cells. This medicine may be used for other purposes; ask your health care provider or pharmacist if you have questions. COMMON BRAND NAME(S): BELRAPZO, BENDEKA, Treanda, VIVIMUSTA What should I tell my care team before I take this medication? They need to know if you have any of these conditions: Infection, especially a viral infection, such as chickenpox, cold sores, herpes Kidney disease Liver disease An unusual or allergic reaction to bendamustine, mannitol, other medications, foods, dyes, or preservatives Pregnant or trying to get   pregnant Breast-feeding How should I use this medication? This medication is injected into a vein. It is given by your care team in a hospital or clinic setting. Talk to your care team about the use of this medication in children. Special care  may be needed. Overdosage: If you think you have taken too much of this medicine contact a poison control center or emergency room at once. NOTE: This medicine is only for you. Do not share this medicine with others. What if I miss a dose? Keep appointments for follow-up doses. It is important not to miss your dose. Call your care team if you are unable to keep an appointment. What may interact with this medication? Do not take this medication with any of the following: Clozapine This medication may also interact with the following: Atazanavir Cimetidine Ciprofloxacin Enoxacin Fluvoxamine Medications for seizures, such as carbamazepine, phenobarbital Mexiletine Rifampin Tacrine Thiabendazole Zileuton This list may not describe all possible interactions. Give your health care provider a list of all the medicines, herbs, non-prescription drugs, or dietary supplements you use. Also tell them if you smoke, drink alcohol, or use illegal drugs. Some items may interact with your medicine. What should I watch for while using this medication? Visit your care team for regular checks on your progress. This medication may make you feel generally unwell. This is not uncommon, as chemotherapy can affect healthy cells as well as cancer cells. Report any side effects. Continue your course of treatment even though you feel ill unless your care team tells you to stop. You may need blood work while taking this medication. This medication may increase your risk of getting an infection. Call your care team for advice if you get a fever, chills, sore throat, or other symptoms of a cold or flu. Do not treat yourself. Try to avoid being around people who are sick. This medication may cause serious skin reactions. They can happen weeks to months after starting the medication. Contact your care team right away if you notice fevers or flu-like symptoms with a rash. The rash may be red or purple and then turn into  blisters or peeling of the skin. You may also notice a red rash with swelling of the face, lips, or lymph nodes in your neck or under your arms. In some patients, this medication may cause a serious brain infection that may cause death. If you have any problems seeing, thinking, speaking, walking, or standing, tell your care team right away. If you cannot reach your care team, urgently seek other source of medical care. This medication may increase your risk to bruise or bleed. Call your care team if you notice any unusual bleeding. Talk to your care team about your risk of cancer. You may be more at risk for certain types of cancer if you take this medication. Talk to your care team about your risk of skin cancer. You may be more at risk for skin cancer if you take this medication. Talk to your care team if you or your partner wish to become pregnant or think either of you might be pregnant. This medication can cause serious birth defects if taken during pregnancy or for up to 6 months after the last dose. A negative pregnancy test is required before starting this medication. A reliable form of contraception is recommended while taking this medication and for 6 months after the last dose. Talk to your care team about reliable forms of contraception. Wear a condom while taking this medication   and for at least 3 months after the last dose. Do not breast-feed while taking this medication or for at least 1 week after the last dose. This medication may cause infertility. Talk to your care team if you are concerned about your fertility. What side effects may I notice from receiving this medication? Side effects that you should report to your care team as soon as possible: Allergic reactions--skin rash, itching, hives, swelling of the face, lips, tongue, or throat Infection--fever, chills, cough, sore throat, wounds that don't heal, pain or trouble when passing urine, general feeling of discomfort or being  unwell Infusion reactions--chest pain, shortness of breath or trouble breathing, feeling faint or lightheaded Liver injury--right upper belly pain, loss of appetite, nausea, light-colored stool, dark yellow or brown urine, yellowing skin or eyes, unusual weakness or fatigue Low red blood cell level--unusual weakness or fatigue, dizziness, headache, trouble breathing Painful swelling, warmth, or redness of the skin, blisters or sores at the infusion site Rash, fever, and swollen lymph nodes Redness, blistering, peeling, or loosening of the skin, including inside the mouth Tumor lysis syndrome (TLS)--nausea, vomiting, diarrhea, decrease in the amount of urine, dark urine, unusual weakness or fatigue, confusion, muscle pain or cramps, fast or irregular heartbeat, joint pain Unusual bruising or bleeding Side effects that usually do not require medical attention (report to your care team if they continue or are bothersome): Diarrhea Fatigue Headache Loss of appetite Nausea Vomiting This list may not describe all possible side effects. Call your doctor for medical advice about side effects. You may report side effects to FDA at 1-800-FDA-1088. Where should I keep my medication? This medication is given in a hospital or clinic. It will not be stored at home. NOTE: This sheet is a summary. It may not cover all possible information. If you have questions about this medicine, talk to your doctor, pharmacist, or health care provider.  2023 [employer] Standard (2022-01-02 00:00:00)  

## 2023-01-26 DIAGNOSIS — C8204 Follicular lymphoma grade I, lymph nodes of axilla and upper limb: Secondary | ICD-10-CM | POA: Diagnosis not present

## 2023-01-30 DIAGNOSIS — E282 Polycystic ovarian syndrome: Secondary | ICD-10-CM | POA: Diagnosis not present

## 2023-01-30 DIAGNOSIS — E010 Iodine-deficiency related diffuse (endemic) goiter: Secondary | ICD-10-CM | POA: Diagnosis not present

## 2023-01-30 DIAGNOSIS — Z1331 Encounter for screening for depression: Secondary | ICD-10-CM | POA: Diagnosis not present

## 2023-01-30 DIAGNOSIS — F39 Unspecified mood [affective] disorder: Secondary | ICD-10-CM | POA: Diagnosis not present

## 2023-01-30 DIAGNOSIS — C8299 Follicular lymphoma, unspecified, extranodal and solid organ sites: Secondary | ICD-10-CM | POA: Diagnosis not present

## 2023-01-30 DIAGNOSIS — Z1339 Encounter for screening examination for other mental health and behavioral disorders: Secondary | ICD-10-CM | POA: Diagnosis not present

## 2023-01-30 DIAGNOSIS — Z0000 Encounter for general adult medical examination without abnormal findings: Secondary | ICD-10-CM | POA: Diagnosis not present

## 2023-01-31 ENCOUNTER — Ambulatory Visit: Payer: BC Managed Care – PPO

## 2023-01-31 ENCOUNTER — Inpatient Hospital Stay: Payer: BC Managed Care – PPO

## 2023-02-06 ENCOUNTER — Inpatient Hospital Stay: Payer: BC Managed Care – PPO

## 2023-02-06 ENCOUNTER — Telehealth: Payer: Self-pay

## 2023-02-06 DIAGNOSIS — Z86010 Personal history of colonic polyps: Secondary | ICD-10-CM | POA: Diagnosis not present

## 2023-02-06 DIAGNOSIS — Z800 Family history of malignant neoplasm of digestive organs: Secondary | ICD-10-CM | POA: Diagnosis not present

## 2023-02-06 DIAGNOSIS — Z5111 Encounter for antineoplastic chemotherapy: Secondary | ICD-10-CM | POA: Diagnosis not present

## 2023-02-06 DIAGNOSIS — C8204 Follicular lymphoma grade I, lymph nodes of axilla and upper limb: Secondary | ICD-10-CM

## 2023-02-06 DIAGNOSIS — E282 Polycystic ovarian syndrome: Secondary | ICD-10-CM | POA: Diagnosis not present

## 2023-02-06 DIAGNOSIS — Z79899 Other long term (current) drug therapy: Secondary | ICD-10-CM | POA: Diagnosis not present

## 2023-02-06 LAB — CBC WITH DIFFERENTIAL (CANCER CENTER ONLY)
Abs Immature Granulocytes: 0.01 10*3/uL (ref 0.00–0.07)
Basophils Absolute: 0 10*3/uL (ref 0.0–0.1)
Basophils Relative: 1 %
Eosinophils Absolute: 0.2 10*3/uL (ref 0.0–0.5)
Eosinophils Relative: 3 %
HCT: 38.1 % (ref 36.0–46.0)
Hemoglobin: 12.4 g/dL (ref 12.0–15.0)
Immature Granulocytes: 0 %
Lymphocytes Relative: 13 %
Lymphs Abs: 0.7 10*3/uL (ref 0.7–4.0)
MCH: 30.4 pg (ref 26.0–34.0)
MCHC: 32.5 g/dL (ref 30.0–36.0)
MCV: 93.4 fL (ref 80.0–100.0)
Monocytes Absolute: 0.7 10*3/uL (ref 0.1–1.0)
Monocytes Relative: 13 %
Neutro Abs: 3.6 10*3/uL (ref 1.7–7.7)
Neutrophils Relative %: 70 %
Platelet Count: 202 10*3/uL (ref 150–400)
RBC: 4.08 MIL/uL (ref 3.87–5.11)
RDW: 12.7 % (ref 11.5–15.5)
WBC Count: 5.2 10*3/uL (ref 4.0–10.5)
nRBC: 0 % (ref 0.0–0.2)

## 2023-02-06 NOTE — Telephone Encounter (Signed)
The patient expressed comprehension and did not raise any additional questions or concerns.

## 2023-02-06 NOTE — Telephone Encounter (Signed)
-----   Message from Rana Snare, NP sent at 02/06/2023  4:14 PM EDT ----- Please let her know labs from today look good.  Follow-up as scheduled.

## 2023-02-07 ENCOUNTER — Inpatient Hospital Stay: Payer: BC Managed Care – PPO | Admitting: Nurse Practitioner

## 2023-02-07 ENCOUNTER — Inpatient Hospital Stay: Payer: BC Managed Care – PPO

## 2023-02-13 DIAGNOSIS — R591 Generalized enlarged lymph nodes: Secondary | ICD-10-CM | POA: Diagnosis not present

## 2023-02-13 DIAGNOSIS — C8208 Follicular lymphoma grade I, lymph nodes of multiple sites: Secondary | ICD-10-CM | POA: Diagnosis not present

## 2023-02-14 ENCOUNTER — Inpatient Hospital Stay: Payer: BC Managed Care – PPO

## 2023-02-14 ENCOUNTER — Inpatient Hospital Stay: Payer: BC Managed Care – PPO | Admitting: Oncology

## 2023-02-15 ENCOUNTER — Other Ambulatory Visit: Payer: Self-pay | Admitting: Oncology

## 2023-02-15 DIAGNOSIS — C8299 Follicular lymphoma, unspecified, extranodal and solid organ sites: Secondary | ICD-10-CM

## 2023-02-21 ENCOUNTER — Inpatient Hospital Stay (HOSPITAL_BASED_OUTPATIENT_CLINIC_OR_DEPARTMENT_OTHER): Payer: BC Managed Care – PPO | Admitting: Nurse Practitioner

## 2023-02-21 ENCOUNTER — Inpatient Hospital Stay: Payer: BC Managed Care – PPO

## 2023-02-21 ENCOUNTER — Encounter: Payer: Self-pay | Admitting: Nurse Practitioner

## 2023-02-21 ENCOUNTER — Inpatient Hospital Stay: Payer: BC Managed Care – PPO | Attending: Oncology

## 2023-02-21 VITALS — BP 92/57 | HR 73 | Temp 98.6°F | Resp 18

## 2023-02-21 VITALS — BP 114/70 | HR 81 | Temp 98.2°F | Resp 18 | Ht 63.0 in | Wt 139.3 lb

## 2023-02-21 DIAGNOSIS — C8299 Follicular lymphoma, unspecified, extranodal and solid organ sites: Secondary | ICD-10-CM

## 2023-02-21 DIAGNOSIS — Z79899 Other long term (current) drug therapy: Secondary | ICD-10-CM | POA: Insufficient documentation

## 2023-02-21 DIAGNOSIS — R21 Rash and other nonspecific skin eruption: Secondary | ICD-10-CM | POA: Diagnosis not present

## 2023-02-21 DIAGNOSIS — C8204 Follicular lymphoma grade I, lymph nodes of axilla and upper limb: Secondary | ICD-10-CM | POA: Insufficient documentation

## 2023-02-21 DIAGNOSIS — Z5111 Encounter for antineoplastic chemotherapy: Secondary | ICD-10-CM | POA: Insufficient documentation

## 2023-02-21 LAB — CMP (CANCER CENTER ONLY)
ALT: 11 U/L (ref 0–44)
AST: 14 U/L — ABNORMAL LOW (ref 15–41)
Albumin: 4.5 g/dL (ref 3.5–5.0)
Alkaline Phosphatase: 70 U/L (ref 38–126)
Anion gap: 9 (ref 5–15)
BUN: 10 mg/dL (ref 6–20)
CO2: 24 mmol/L (ref 22–32)
Calcium: 9.5 mg/dL (ref 8.9–10.3)
Chloride: 105 mmol/L (ref 98–111)
Creatinine: 0.67 mg/dL (ref 0.44–1.00)
GFR, Estimated: >60 mL/min (ref >60)
Glucose, Bld: 82 mg/dL (ref 70–99)
Potassium: 3.9 mmol/L (ref 3.5–5.1)
Sodium: 138 mmol/L (ref 135–145)
Total Bilirubin: 0.5 mg/dL (ref 0.3–1.2)
Total Protein: 6.6 g/dL (ref 6.5–8.1)

## 2023-02-21 LAB — CBC WITH DIFFERENTIAL (CANCER CENTER ONLY)
Abs Immature Granulocytes: 0.01 10*3/uL (ref 0.00–0.07)
Basophils Absolute: 0.1 10*3/uL (ref 0.0–0.1)
Basophils Relative: 1 %
Eosinophils Absolute: 0.1 10*3/uL (ref 0.0–0.5)
Eosinophils Relative: 2 %
HCT: 39 % (ref 36.0–46.0)
Hemoglobin: 13.1 g/dL (ref 12.0–15.0)
Immature Granulocytes: 0 %
Lymphocytes Relative: 18 %
Lymphs Abs: 0.7 10*3/uL (ref 0.7–4.0)
MCH: 30.8 pg (ref 26.0–34.0)
MCHC: 33.6 g/dL (ref 30.0–36.0)
MCV: 91.8 fL (ref 80.0–100.0)
Monocytes Absolute: 0.6 10*3/uL (ref 0.1–1.0)
Monocytes Relative: 15 %
Neutro Abs: 2.6 10*3/uL (ref 1.7–7.7)
Neutrophils Relative %: 64 %
Platelet Count: 130 10*3/uL — ABNORMAL LOW (ref 150–400)
RBC: 4.25 MIL/uL (ref 3.87–5.11)
RDW: 12.8 % (ref 11.5–15.5)
WBC Count: 4 10*3/uL (ref 4.0–10.5)
nRBC: 0 % (ref 0.0–0.2)

## 2023-02-21 LAB — PREGNANCY, URINE: Preg Test, Ur: NEGATIVE

## 2023-02-21 MED ORDER — DIPHENHYDRAMINE HCL 25 MG PO CAPS
50.0000 mg | ORAL_CAPSULE | Freq: Once | ORAL | Status: AC
  Administered 2023-02-21: 50 mg via ORAL
  Filled 2023-02-21: qty 2

## 2023-02-21 MED ORDER — SODIUM CHLORIDE 0.9 % IV SOLN: Freq: Once | INTRAVENOUS | Status: AC

## 2023-02-21 MED ORDER — SODIUM CHLORIDE 0.9 % IV SOLN
375.0000 mg/m2 | Freq: Once | INTRAVENOUS | Status: AC
  Administered 2023-02-21: 600 mg via INTRAVENOUS
  Filled 2023-02-21: qty 50

## 2023-02-21 MED ORDER — ACETAMINOPHEN 325 MG PO TABS
650.0000 mg | ORAL_TABLET | Freq: Once | ORAL | Status: AC
  Administered 2023-02-21: 650 mg via ORAL
  Filled 2023-02-21: qty 2

## 2023-02-21 MED ORDER — SODIUM CHLORIDE 0.9 % IV SOLN
90.0000 mg/m2 | Freq: Once | INTRAVENOUS | Status: AC
  Administered 2023-02-21: 155 mg via INTRAVENOUS
  Filled 2023-02-21: qty 6.2

## 2023-02-21 MED ORDER — SODIUM CHLORIDE 0.9 % IV SOLN
10.0000 mg | Freq: Once | INTRAVENOUS | Status: AC
  Administered 2023-02-21: 10 mg via INTRAVENOUS
  Filled 2023-02-21: qty 10

## 2023-02-21 MED ORDER — PALONOSETRON HCL INJECTION 0.25 MG/5ML
0.2500 mg | Freq: Once | INTRAVENOUS | Status: AC
  Administered 2023-02-21: 0.25 mg via INTRAVENOUS
  Filled 2023-02-21: qty 5

## 2023-02-21 NOTE — Progress Notes (Signed)
Patient seen by Lisa Thomas NP today  Vitals are within treatment parameters.  Labs reviewed by Lisa Thomas NP and are within treatment parameters.  Per physician team, patient is ready for treatment and there are NO modifications to the treatment plan.     

## 2023-02-21 NOTE — Patient Instructions (Signed)
[facility]   Discharge Instructions: Thank you for choosing [facility] to provide your oncology and hematology care.   If you have a lab appointment with the Cancer Center, please go directly to the Cancer Center and check in at the registration area.   Wear comfortable clothing and clothing appropriate for easy access to any Portacath or PICC line.   We strive to give you quality time with your provider. You may need to reschedule your appointment if you arrive late (15 or more minutes).  Arriving late affects you and other patients whose appointments are after yours.  Also, if you miss three or more appointments without notifying the office, you may be dismissed from the clinic at the provider's discretion.      For prescription refill requests, have your pharmacy contact our office and allow 72 hours for refills to be completed.    Today you received the following chemotherapy and/or immunotherapy agents Rituximab-pvvr, Bendeka      To help prevent nausea and vomiting after your treatment, we encourage you to take your nausea medication as directed.  BELOW ARE SYMPTOMS THAT SHOULD BE REPORTED IMMEDIATELY: *FEVER GREATER THAN 100.4 F (38 C) OR HIGHER *CHILLS OR SWEATING *NAUSEA AND VOMITING THAT IS NOT CONTROLLED WITH YOUR NAUSEA MEDICATION *UNUSUAL SHORTNESS OF BREATH *UNUSUAL BRUISING OR BLEEDING *URINARY PROBLEMS (pain or burning when urinating, or frequent urination) *BOWEL PROBLEMS (unusual diarrhea, constipation, pain near the anus) TENDERNESS IN MOUTH AND THROAT WITH OR WITHOUT PRESENCE OF ULCERS (sore throat, sores in mouth, or a toothache) UNUSUAL RASH, SWELLING OR PAIN  UNUSUAL VAGINAL DISCHARGE OR ITCHING   Items with * indicate a potential emergency and should be followed up as soon as possible or go to the Emergency Department if any problems should occur.  Please show the CHEMOTHERAPY ALERT CARD or IMMUNOTHERAPY ALERT  CARD at check-in to the Emergency Department and triage nurse.  Should you have questions after your visit or need to cancel or reschedule your appointment, please contact [facility]  Dept: [phone]  and follow the prompts.  Office hours are 8:00 a.m. to 4:30 p.m. Monday - Friday. Please note that voicemails left after 4:00 p.m. may not be returned until the following business day.  We are closed weekends and major holidays. You have access to a nurse at all times for urgent questions. Please call the main number to the clinic Dept: [phone] and follow the prompts.   For any non-urgent questions, you may also contact your provider using MyChart. We now offer e-Visits for anyone 18 and older to request care online for non-urgent symptoms. For details visit [email or web address].   Also download the MyChart app! Go to the app store, search "MyChart", open the app, select [facility], and log in with your MyChart username and password.  Rituximab Injection What is this medication? RITUXIMAB (ri TUX i mab) treats leukemia and lymphoma. It works by blocking a protein that causes cancer cells to grow and multiply. This helps to slow or stop the spread of cancer cells. It may also be used to treat autoimmune conditions, such as arthritis. It works by slowing down an overactive immune system. It is a monoclonal antibody. This medicine may be used for other purposes; ask your health care provider or pharmacist if you have questions. COMMON BRAND NAME(S): RIABNI, Rituxan, RUXIENCE, truxima What should I tell my care team before I take this medication? They need   to know if you have any of these conditions: Chest pain Heart disease Immune system problems Infection, such as chickenpox, cold sores, hepatitis B, herpes Irregular heartbeat or rhythm Kidney disease Low blood counts, such as low white cells, platelets, red cells Lung disease Recent or upcoming  vaccine An unusual or allergic reaction to rituximab, other medications, foods, dyes, or preservatives Pregnant or trying to get pregnant Breast-feeding How should I use this medication? This medication is injected into a vein. It is given by a care team in a hospital or clinic setting. A special MedGuide will be given to you before each treatment. Be sure to read this information carefully each time. Talk to your care team about the use of this medication in children. While this medication may be prescribed for children as young as 6 months for selected conditions, precautions do apply. Overdosage: If you think you have taken too much of this medicine contact a poison control center or emergency room at once. NOTE: This medicine is only for you. Do not share this medicine with others. What if I miss a dose? Keep appointments for follow-up doses. It is important not to miss your dose. Call your care team if you are unable to keep an appointment. What may interact with this medication? Do not take this medication with any of the following: Live vaccines This medication may also interact with the following: Cisplatin This list may not describe all possible interactions. Give your health care provider a list of all the medicines, herbs, non-prescription drugs, or dietary supplements you use. Also tell them if you smoke, drink alcohol, or use illegal drugs. Some items may interact with your medicine. What should I watch for while using this medication? Your condition will be monitored carefully while you are receiving this medication. You may need blood work while taking this medication. This medication can cause serious infusion reactions. To reduce the risk your care team may give you other medications to take before receiving this one. Be sure to follow the directions from your care team. This medication may increase your risk of getting an infection. Call your care team for advice if you get a  fever, chills, sore throat, or other symptoms of a cold or flu. Do not treat yourself. Try to avoid being around people who are sick. Call your care team if you are around anyone with measles, chickenpox, or if you develop sores or blisters that do not heal properly. Avoid taking medications that contain aspirin, acetaminophen, ibuprofen, naproxen, or ketoprofen unless instructed by your care team. These medications may hide a fever. This medication may cause serious skin reactions. They can happen weeks to months after starting the medication. Contact your care team right away if you notice fevers or flu-like symptoms with a rash. The rash may be red or purple and then turn into blisters or peeling of the skin. You may also notice a red rash with swelling of the face, lips, or lymph nodes in your neck or under your arms. In some patients, this medication may cause a serious brain infection that may cause death. If you have any problems seeing, thinking, speaking, walking, or standing, tell your care team right away. If you cannot reach your care team, urgently seek another source of medical care. Talk to your care team if you may be pregnant. Serious birth defects can occur if you take this medication during pregnancy and for 12 months after the last dose. You will need a negative pregnancy   test before starting this medication. Contraception is recommended while taking this medication and for 12 months after the last dose. Your care team can help you find the option that works for you. Do not breastfeed while taking this medication and for at least 6 months after the last dose. What side effects may I notice from receiving this medication? Side effects that you should report to your care team as soon as possible: Allergic reactions or angioedema--skin rash, itching or hives, swelling of the face, eyes, lips, tongue, arms, or legs, trouble swallowing or breathing Bowel blockage--stomach cramping, unable to  have a bowel movement or pass gas, loss of appetite, vomiting Dizziness, loss of balance or coordination, confusion or trouble speaking Heart attack--pain or tightness in the chest, shoulders, arms, or jaw, nausea, shortness of breath, cold or clammy skin, feeling faint or lightheaded Heart rhythm changes--fast or irregular heartbeat, dizziness, feeling faint or lightheaded, chest pain, trouble breathing Infection--fever, chills, cough, sore throat, wounds that don't heal, pain or trouble when passing urine, general feeling of discomfort or being unwell Infusion reactions--chest pain, shortness of breath or trouble breathing, feeling faint or lightheaded Kidney injury--decrease in the amount of urine, swelling of the ankles, hands, or feet Liver injury--right upper belly pain, loss of appetite, nausea, light-colored stool, dark yellow or brown urine, yellowing skin or eyes, unusual weakness or fatigue Redness, blistering, peeling, or loosening of the skin, including inside the mouth Stomach pain that is severe, does not go away, or gets worse Tumor lysis syndrome (TLS)--nausea, vomiting, diarrhea, decrease in the amount of urine, dark urine, unusual weakness or fatigue, confusion, muscle pain or cramps, fast or irregular heartbeat, joint pain Side effects that usually do not require medical attention (report to your care team if they continue or are bothersome): Headache Joint pain Nausea Runny or stuffy nose Unusual weakness or fatigue This list may not describe all possible side effects. Call your doctor for medical advice about side effects. You may report side effects to FDA at 1-800-FDA-1088. Where should I keep my medication? This medication is given in a hospital or clinic. It will not be stored at home. NOTE: This sheet is a summary. It may not cover all possible information. If you have questions about this medicine, talk to your doctor, pharmacist, or health care provider.  2023  Elsevier/Gold Standard (2022-02-07 00:00:00) Bendamustine Injection What is this medication? BENDAMUSTINE (BEN da MUS teen) treats leukemia and lymphoma. It works by slowing down the growth of cancer cells. This medicine may be used for other purposes; ask your health care provider or pharmacist if you have questions. COMMON BRAND NAME(S): BELRAPZO, BENDEKA, Treanda, VIVIMUSTA What should I tell my care team before I take this medication? They need to know if you have any of these conditions: Infection, especially a viral infection, such as chickenpox, cold sores, herpes Kidney disease Liver disease An unusual or allergic reaction to bendamustine, mannitol, other medications, foods, dyes, or preservatives Pregnant or trying to get pregnant Breast-feeding How should I use this medication? This medication is injected into a vein. It is given by your care team in a hospital or clinic setting. Talk to your care team about the use of this medication in children. Special care may be needed. Overdosage: If you think you have taken too much of this medicine contact a poison control center or emergency room at once. NOTE: This medicine is only for you. Do not share this medicine with others. What if I miss a dose?   Keep appointments for follow-up doses. It is important not to miss your dose. Call your care team if you are unable to keep an appointment. What may interact with this medication? Do not take this medication with any of the following: Clozapine This medication may also interact with the following: Atazanavir Cimetidine Ciprofloxacin Enoxacin Fluvoxamine Medications for seizures, such as carbamazepine, phenobarbital Mexiletine Rifampin Tacrine Thiabendazole Zileuton This list may not describe all possible interactions. Give your health care provider a list of all the medicines, herbs, non-prescription drugs, or dietary supplements you use. Also tell them if you smoke, drink  alcohol, or use illegal drugs. Some items may interact with your medicine. What should I watch for while using this medication? Visit your care team for regular checks on your progress. This medication may make you feel generally unwell. This is not uncommon, as chemotherapy can affect healthy cells as well as cancer cells. Report any side effects. Continue your course of treatment even though you feel ill unless your care team tells you to stop. You may need blood work while taking this medication. This medication may increase your risk of getting an infection. Call your care team for advice if you get a fever, chills, sore throat, or other symptoms of a cold or flu. Do not treat yourself. Try to avoid being around people who are sick. This medication may cause serious skin reactions. They can happen weeks to months after starting the medication. Contact your care team right away if you notice fevers or flu-like symptoms with a rash. The rash may be red or purple and then turn into blisters or peeling of the skin. You may also notice a red rash with swelling of the face, lips, or lymph nodes in your neck or under your arms. In some patients, this medication may cause a serious brain infection that may cause death. If you have any problems seeing, thinking, speaking, walking, or standing, tell your care team right away. If you cannot reach your care team, urgently seek other source of medical care. This medication may increase your risk to bruise or bleed. Call your care team if you notice any unusual bleeding. Talk to your care team about your risk of cancer. You may be more at risk for certain types of cancer if you take this medication. Talk to your care team about your risk of skin cancer. You may be more at risk for skin cancer if you take this medication. Talk to your care team if you or your partner wish to become pregnant or think either of you might be pregnant. This medication can cause serious  birth defects if taken during pregnancy or for up to 6 months after the last dose. A negative pregnancy test is required before starting this medication. A reliable form of contraception is recommended while taking this medication and for 6 months after the last dose. Talk to your care team about reliable forms of contraception. Wear a condom while taking this medication and for at least 3 months after the last dose. Do not breast-feed while taking this medication or for at least 1 week after the last dose. This medication may cause infertility. Talk to your care team if you are concerned about your fertility. What side effects may I notice from receiving this medication? Side effects that you should report to your care team as soon as possible: Allergic reactions--skin rash, itching, hives, swelling of the face, lips, tongue, or throat Infection--fever, chills, cough, sore throat, wounds that   don't heal, pain or trouble when passing urine, general feeling of discomfort or being unwell Infusion reactions--chest pain, shortness of breath or trouble breathing, feeling faint or lightheaded Liver injury--right upper belly pain, loss of appetite, nausea, light-colored stool, dark yellow or brown urine, yellowing skin or eyes, unusual weakness or fatigue Low red blood cell level--unusual weakness or fatigue, dizziness, headache, trouble breathing Painful swelling, warmth, or redness of the skin, blisters or sores at the infusion site Rash, fever, and swollen lymph nodes Redness, blistering, peeling, or loosening of the skin, including inside the mouth Tumor lysis syndrome (TLS)--nausea, vomiting, diarrhea, decrease in the amount of urine, dark urine, unusual weakness or fatigue, confusion, muscle pain or cramps, fast or irregular heartbeat, joint pain Unusual bruising or bleeding Side effects that usually do not require medical attention (report to your care team if they continue or are  bothersome): Diarrhea Fatigue Headache Loss of appetite Nausea Vomiting This list may not describe all possible side effects. Call your doctor for medical advice about side effects. You may report side effects to FDA at 1-800-FDA-1088. Where should I keep my medication? This medication is given in a hospital or clinic. It will not be stored at home. NOTE: This sheet is a summary. It may not cover all possible information. If you have questions about this medicine, talk to your doctor, pharmacist, or health care provider.  2023 Elsevier/Gold Standard (2022-01-02 00:00:00)   

## 2023-02-21 NOTE — Progress Notes (Signed)
  [facility] OFFICE PROGRESS NOTE   Diagnosis: Non-Hodgkin's lymphoma  INTERVAL HISTORY:   Adrienne Morgan returns as scheduled.  She completed cycle 1 Bendamustine/Rituxan 01/24/2023.  She had mild nausea and malaise for a few days after treatment.  No signs of an allergic reaction.  She had a single tiny mouth sore which resolved quickly.  No diarrhea.  No bleeding.  No rash.  She notes the sweats are occurring less frequently, maybe 2-3 times a week.  The right groin lymph node is smaller.  Objective:  Vital signs in last 24 hours:  Blood pressure 114/70, pulse 81, temperature 98.2 F (36.8 C), temperature source Oral, resp. rate 18, height 5\' 3"  (1.6 m), weight 139 lb 4.8 oz (63.2 kg), SpO2 100 %.    HEENT: No thrush or ulcers. Lymphatics: Question tiny bilateral axillary nodes, right inguinal node is less prominent. Resp: Lungs clear bilaterally. Cardio: Regular rate and rhythm. GI: No hepatosplenomegaly. Vascular: No leg edema. Neuro: Alert and oriented. Skin: No rash.   Lab Results:  Lab Results  Component Value Date   WBC 4.0 02/21/2023   HGB 13.1 02/21/2023   HCT 39.0 02/21/2023   MCV 91.8 02/21/2023   PLT 130 (L) 02/21/2023   NEUTROABS 2.6 02/21/2023    Imaging:  No results found.  Medications: I have reviewed the patient's current medications.  Assessment/Plan: Follicular lymphoma-low-grade, clinical stage III, FLIPI-intermediate risk Bilateral axillary ultrasound 08/29/2023-mildly prominent lymph nodes, right axillary node without a defined preserved fatty hilum Ultrasound-guided biopsy of right axillary lymph node 12/19/2022-follicular lymphoma, overall grade 1 of 3, focal grade 2, Ki-67 less than 10% with areas of under 5% and areas at 15-20%, CD20, BCL6, CD10 positive.  Bcl-2 positive.  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  She tolerated the first cycle of Bendamustine/rituximab well.  She is having less frequent night sweats.  Plan to proceed with cycle 2 Bendamustine/rituximab today as scheduled.  CBC and chemistry panel reviewed.  Labs adequate to proceed as above.  She has mild thrombocytopenia.  She understands to contact the office with bleeding.  Plan for nadir CBC in 2 weeks.  She will return for follow-up and cycle 3 Bendamustine/rituximab in 4 weeks.  She will contact the office in the interim with any problems.    Adrienne Morgan ANP/GNP-BC   02/21/2023  9:50 AM

## 2023-02-22 ENCOUNTER — Other Ambulatory Visit: Payer: Self-pay

## 2023-02-22 ENCOUNTER — Inpatient Hospital Stay: Payer: BC Managed Care – PPO

## 2023-02-22 VITALS — BP 99/62 | HR 83 | Temp 99.1°F | Resp 18

## 2023-02-22 DIAGNOSIS — Z79899 Other long term (current) drug therapy: Secondary | ICD-10-CM | POA: Diagnosis not present

## 2023-02-22 DIAGNOSIS — C8204 Follicular lymphoma grade I, lymph nodes of axilla and upper limb: Secondary | ICD-10-CM | POA: Diagnosis not present

## 2023-02-22 DIAGNOSIS — R21 Rash and other nonspecific skin eruption: Secondary | ICD-10-CM | POA: Diagnosis not present

## 2023-02-22 DIAGNOSIS — Z5111 Encounter for antineoplastic chemotherapy: Secondary | ICD-10-CM | POA: Diagnosis not present

## 2023-02-22 MED ORDER — DIPHENHYDRAMINE HCL 25 MG PO CAPS
25.0000 mg | ORAL_CAPSULE | Freq: Once | ORAL | Status: AC
  Administered 2023-02-22: 25 mg via ORAL
  Filled 2023-02-22: qty 1

## 2023-02-22 MED ORDER — ACETAMINOPHEN 325 MG PO TABS
650.0000 mg | ORAL_TABLET | Freq: Once | ORAL | Status: AC
  Administered 2023-02-22: 650 mg via ORAL
  Filled 2023-02-22: qty 2

## 2023-02-22 NOTE — Progress Notes (Signed)
Adrienne Morgan completed day 1 Rituxan/Bendamustine 02/21/2023.  She presents today before proceeding with day 2 Bendamustine.  She reports temperature 100 to 101 degrees during the night, arthralgias and myalgias.  On the way here this morning she noticed a rash on her arms.  No significant pruritus.  She reports malaise a few days after the last treatment, no significant rash.   Examination here shows a faint erythematous rash across the back, abdomen and lower anterior arms.  Palms spared.  No oral lesions.  She is well-appearing in general.  Vital signs are stable.  We discussed symptoms being related to rituximab or Bendamustine.  There are reports of delayed onset hypersensitivity rash to Bendamustine.  Arthralgias and myalgias can occur with either.  We decided to hold today's treatment.  She was given Benadryl and Tylenol in the office.  She will continue as needed at home, dosing instructions were reviewed.  She will contact us in the morning with an update.  We will see how the rash progresses over the next few days.  If the rash worsens she will be treated with a Medrol Dosepak.

## 2023-02-22 NOTE — Progress Notes (Signed)
Patient reported to infusion room for day 2 of treatment today.  Patient reported she started to have flu-like symptoms starting yesterday (02/21/23) evening (body aches, fever of 101 F during the overnight hours, red spotted rash on bilateral forearms and back).  Patient denied taking anything for fever or rash as she "did not want to mask any symptoms". [name], NP made aware and came to see patient in infusion room.  Verbal orders received for tylenol and benadryl (see MAR).   Per [name], NP, treatment to be held today.  Patient instructed to take tylenol or ibuprofen as needed for fever and benadryl as needed for rash.  Patient to contact office tomorrow morning with update.  Patient verbalized understanding.

## 2023-02-23 ENCOUNTER — Encounter: Payer: Self-pay | Admitting: Nurse Practitioner

## 2023-02-28 ENCOUNTER — Inpatient Hospital Stay: Payer: BC Managed Care – PPO

## 2023-02-28 DIAGNOSIS — C8204 Follicular lymphoma grade I, lymph nodes of axilla and upper limb: Secondary | ICD-10-CM

## 2023-02-28 DIAGNOSIS — C8299 Follicular lymphoma, unspecified, extranodal and solid organ sites: Secondary | ICD-10-CM

## 2023-02-28 DIAGNOSIS — R21 Rash and other nonspecific skin eruption: Secondary | ICD-10-CM | POA: Diagnosis not present

## 2023-02-28 DIAGNOSIS — Z79899 Other long term (current) drug therapy: Secondary | ICD-10-CM | POA: Diagnosis not present

## 2023-02-28 DIAGNOSIS — Z5111 Encounter for antineoplastic chemotherapy: Secondary | ICD-10-CM | POA: Diagnosis not present

## 2023-02-28 LAB — CBC WITH DIFFERENTIAL (CANCER CENTER ONLY)
Abs Immature Granulocytes: 0.01 10*3/uL (ref 0.00–0.07)
Basophils Absolute: 0 10*3/uL (ref 0.0–0.1)
Basophils Relative: 1 %
Eosinophils Absolute: 0.2 10*3/uL (ref 0.0–0.5)
Eosinophils Relative: 5 %
HCT: 37.4 % (ref 36.0–46.0)
Hemoglobin: 12.5 g/dL (ref 12.0–15.0)
Immature Granulocytes: 0 %
Lymphocytes Relative: 9 %
Lymphs Abs: 0.3 10*3/uL — ABNORMAL LOW (ref 0.7–4.0)
MCH: 30.7 pg (ref 26.0–34.0)
MCHC: 33.4 g/dL (ref 30.0–36.0)
MCV: 91.9 fL (ref 80.0–100.0)
Monocytes Absolute: 0.4 10*3/uL (ref 0.1–1.0)
Monocytes Relative: 11 %
Neutro Abs: 2.4 10*3/uL (ref 1.7–7.7)
Neutrophils Relative %: 74 %
Platelet Count: 179 10*3/uL (ref 150–400)
RBC: 4.07 MIL/uL (ref 3.87–5.11)
RDW: 12.5 % (ref 11.5–15.5)
WBC Count: 3.2 10*3/uL — ABNORMAL LOW (ref 4.0–10.5)
nRBC: 0 % (ref 0.0–0.2)

## 2023-03-01 DIAGNOSIS — Z30433 Encounter for removal and reinsertion of intrauterine contraceptive device: Secondary | ICD-10-CM | POA: Diagnosis not present

## 2023-03-07 ENCOUNTER — Telehealth: Payer: Self-pay

## 2023-03-07 ENCOUNTER — Inpatient Hospital Stay: Payer: BC Managed Care – PPO

## 2023-03-07 DIAGNOSIS — C8299 Follicular lymphoma, unspecified, extranodal and solid organ sites: Secondary | ICD-10-CM

## 2023-03-07 DIAGNOSIS — C8204 Follicular lymphoma grade I, lymph nodes of axilla and upper limb: Secondary | ICD-10-CM | POA: Diagnosis not present

## 2023-03-07 DIAGNOSIS — Z5111 Encounter for antineoplastic chemotherapy: Secondary | ICD-10-CM | POA: Diagnosis not present

## 2023-03-07 DIAGNOSIS — Z79899 Other long term (current) drug therapy: Secondary | ICD-10-CM | POA: Diagnosis not present

## 2023-03-07 DIAGNOSIS — R21 Rash and other nonspecific skin eruption: Secondary | ICD-10-CM | POA: Diagnosis not present

## 2023-03-07 LAB — CBC WITH DIFFERENTIAL (CANCER CENTER ONLY)
Abs Immature Granulocytes: 0.01 10*3/uL (ref 0.00–0.07)
Basophils Absolute: 0 10*3/uL (ref 0.0–0.1)
Basophils Relative: 1 %
Eosinophils Absolute: 0.2 10*3/uL (ref 0.0–0.5)
Eosinophils Relative: 4 %
HCT: 35.8 % — ABNORMAL LOW (ref 36.0–46.0)
Hemoglobin: 12.2 g/dL (ref 12.0–15.0)
Immature Granulocytes: 0 %
Lymphocytes Relative: 8 %
Lymphs Abs: 0.3 10*3/uL — ABNORMAL LOW (ref 0.7–4.0)
MCH: 31 pg (ref 26.0–34.0)
MCHC: 34.1 g/dL (ref 30.0–36.0)
MCV: 90.9 fL (ref 80.0–100.0)
Monocytes Absolute: 0.6 10*3/uL (ref 0.1–1.0)
Monocytes Relative: 13 %
Neutro Abs: 3.2 10*3/uL (ref 1.7–7.7)
Neutrophils Relative %: 74 %
Platelet Count: 212 10*3/uL (ref 150–400)
RBC: 3.94 MIL/uL (ref 3.87–5.11)
RDW: 12.8 % (ref 11.5–15.5)
WBC Count: 4.3 10*3/uL (ref 4.0–10.5)
nRBC: 0 % (ref 0.0–0.2)

## 2023-03-07 NOTE — Telephone Encounter (Signed)
-----   Message from Rana Snare, NP sent at 03/07/2023 12:37 PM EDT ----- Please let her know CBC looks good, follow-up as scheduled.

## 2023-03-07 NOTE — Telephone Encounter (Signed)
Patient gave verbal understanding and had no further questions or concerns  

## 2023-03-20 ENCOUNTER — Inpatient Hospital Stay: Payer: BC Managed Care – PPO

## 2023-03-20 ENCOUNTER — Telehealth: Payer: Self-pay | Admitting: Oncology

## 2023-03-20 ENCOUNTER — Encounter: Payer: Self-pay | Admitting: *Deleted

## 2023-03-20 ENCOUNTER — Inpatient Hospital Stay (HOSPITAL_BASED_OUTPATIENT_CLINIC_OR_DEPARTMENT_OTHER): Payer: BC Managed Care – PPO | Admitting: Oncology

## 2023-03-20 ENCOUNTER — Inpatient Hospital Stay: Payer: BC Managed Care – PPO | Attending: Oncology

## 2023-03-20 VITALS — BP 90/56 | HR 80 | Temp 98.2°F | Resp 18

## 2023-03-20 VITALS — BP 104/69 | HR 73 | Temp 98.1°F | Resp 18 | Ht 63.0 in | Wt 137.6 lb

## 2023-03-20 DIAGNOSIS — C8204 Follicular lymphoma grade I, lymph nodes of axilla and upper limb: Secondary | ICD-10-CM | POA: Diagnosis not present

## 2023-03-20 DIAGNOSIS — C8299 Follicular lymphoma, unspecified, extranodal and solid organ sites: Secondary | ICD-10-CM

## 2023-03-20 DIAGNOSIS — E282 Polycystic ovarian syndrome: Secondary | ICD-10-CM | POA: Insufficient documentation

## 2023-03-20 DIAGNOSIS — Z8719 Personal history of other diseases of the digestive system: Secondary | ICD-10-CM | POA: Diagnosis not present

## 2023-03-20 DIAGNOSIS — Z5111 Encounter for antineoplastic chemotherapy: Secondary | ICD-10-CM | POA: Diagnosis not present

## 2023-03-20 DIAGNOSIS — Z800 Family history of malignant neoplasm of digestive organs: Secondary | ICD-10-CM | POA: Insufficient documentation

## 2023-03-20 DIAGNOSIS — Z86010 Personal history of colonic polyps: Secondary | ICD-10-CM | POA: Diagnosis not present

## 2023-03-20 DIAGNOSIS — Z79899 Other long term (current) drug therapy: Secondary | ICD-10-CM | POA: Diagnosis not present

## 2023-03-20 LAB — CMP (CANCER CENTER ONLY)
ALT: 8 U/L (ref 0–44)
AST: 11 U/L — ABNORMAL LOW (ref 15–41)
Albumin: 4.3 g/dL (ref 3.5–5.0)
Alkaline Phosphatase: 60 U/L (ref 38–126)
Anion gap: 6 (ref 5–15)
BUN: 11 mg/dL (ref 6–20)
CO2: 26 mmol/L (ref 22–32)
Calcium: 9 mg/dL (ref 8.9–10.3)
Chloride: 106 mmol/L (ref 98–111)
Creatinine: 0.66 mg/dL (ref 0.44–1.00)
GFR, Estimated: >60 mL/min (ref >60)
Glucose, Bld: 99 mg/dL (ref 70–99)
Potassium: 3.8 mmol/L (ref 3.5–5.1)
Sodium: 138 mmol/L (ref 135–145)
Total Bilirubin: 0.4 mg/dL (ref 0.3–1.2)
Total Protein: 6.5 g/dL (ref 6.5–8.1)

## 2023-03-20 LAB — CBC WITH DIFFERENTIAL (CANCER CENTER ONLY)
Abs Immature Granulocytes: 0 10*3/uL (ref 0.00–0.07)
Basophils Absolute: 0 10*3/uL (ref 0.0–0.1)
Basophils Relative: 1 %
Eosinophils Absolute: 0.1 10*3/uL (ref 0.0–0.5)
Eosinophils Relative: 2 %
HCT: 38.4 % (ref 36.0–46.0)
Hemoglobin: 13 g/dL (ref 12.0–15.0)
Immature Granulocytes: 0 %
Lymphocytes Relative: 15 %
Lymphs Abs: 0.4 10*3/uL — ABNORMAL LOW (ref 0.7–4.0)
MCH: 31 pg (ref 26.0–34.0)
MCHC: 33.9 g/dL (ref 30.0–36.0)
MCV: 91.6 fL (ref 80.0–100.0)
Monocytes Absolute: 0.5 10*3/uL (ref 0.1–1.0)
Monocytes Relative: 17 %
Neutro Abs: 1.9 10*3/uL (ref 1.7–7.7)
Neutrophils Relative %: 65 %
Platelet Count: 173 10*3/uL (ref 150–400)
RBC: 4.19 MIL/uL (ref 3.87–5.11)
RDW: 13.2 % (ref 11.5–15.5)
WBC Count: 2.9 10*3/uL — ABNORMAL LOW (ref 4.0–10.5)
nRBC: 0 % (ref 0.0–0.2)

## 2023-03-20 LAB — PREGNANCY, URINE: Preg Test, Ur: NEGATIVE

## 2023-03-20 MED ORDER — PALONOSETRON HCL INJECTION 0.25 MG/5ML
0.2500 mg | Freq: Once | INTRAVENOUS | Status: AC
  Administered 2023-03-20: 0.25 mg via INTRAVENOUS
  Filled 2023-03-20: qty 5

## 2023-03-20 MED ORDER — FAMOTIDINE IN NACL 20-0.9 MG/50ML-% IV SOLN
20.0000 mg | Freq: Once | INTRAVENOUS | Status: AC
  Administered 2023-03-20: 20 mg via INTRAVENOUS
  Filled 2023-03-20: qty 50

## 2023-03-20 MED ORDER — ACETAMINOPHEN 325 MG PO TABS
650.0000 mg | ORAL_TABLET | Freq: Once | ORAL | Status: AC
  Administered 2023-03-20: 650 mg via ORAL
  Filled 2023-03-20: qty 2

## 2023-03-20 MED ORDER — SODIUM CHLORIDE 0.9 % IV SOLN
375.0000 mg/m2 | Freq: Once | INTRAVENOUS | Status: AC
  Administered 2023-03-20: 600 mg via INTRAVENOUS
  Filled 2023-03-20: qty 50

## 2023-03-20 MED ORDER — SODIUM CHLORIDE 0.9 % IV SOLN
10.0000 mg | Freq: Once | INTRAVENOUS | Status: AC
  Administered 2023-03-20: 10 mg via INTRAVENOUS
  Filled 2023-03-20: qty 10

## 2023-03-20 MED ORDER — SODIUM CHLORIDE 0.9 % IV SOLN: Freq: Once | INTRAVENOUS | Status: AC

## 2023-03-20 MED ORDER — SODIUM CHLORIDE 0.9 % IV SOLN
90.0000 mg/m2 | Freq: Once | INTRAVENOUS | Status: AC
  Administered 2023-03-20: 155 mg via INTRAVENOUS
  Filled 2023-03-20: qty 6.2

## 2023-03-20 MED ORDER — DIPHENHYDRAMINE HCL 25 MG PO CAPS
50.0000 mg | ORAL_CAPSULE | Freq: Once | ORAL | Status: AC
  Administered 2023-03-20: 50 mg via ORAL
  Filled 2023-03-20: qty 2

## 2023-03-20 NOTE — Progress Notes (Signed)
[facility] OFFICE PROGRESS NOTE   Diagnosis: Non-Hodgkin's lymphoma  INTERVAL HISTORY:   Adrienne Morgan completed day 1 cycle 2 Bendamustine/rituximab 02/21/2023.  She developed a rash, fever, and arthralgias/myalgias on the evening of day 1.  Day 2 treatment was held.  She reports the fever, arthralgias, myalgias had resolved by the next day.  The rash persisted for 2-3 days.  The rash was over the trunk and extremities.  She did not take medication for the rash. She generally feels well.  Night sweats have improved.  Palpable lymph nodes have decreased in size.  She believes there may be small epitrochlear nodes. She had a sweat each night last weekend.  She is working.   Objective:  Vital signs in last 24 hours:  Blood pressure 104/69, pulse 73, temperature 98.1 F (36.7 C), temperature source Oral, resp. rate 18, height 5\' 3"  (1.6 m), weight 137 lb 9.6 oz (62.4 kg), SpO2 100 %.    HEENT: No thrush or ulcers Lymphatics: No cervical, supraclavicular, axillary, or inguinal nodes, pea-sized?  Left epitrochlear node Resp: Lungs clear bilaterally Cardio: Regular rate and rhythm GI: No hepatosplenomegaly Vascular: No leg edema  Skin: No rash  Portacath/PICC-without erythema  Lab Results:  Lab Results  Component Value Date   WBC 2.9 (L) 03/20/2023   HGB 13.0 03/20/2023   HCT 38.4 03/20/2023   MCV 91.6 03/20/2023   PLT 173 03/20/2023   NEUTROABS 1.9 03/20/2023    CMP  Lab Results  Component Value Date   NA 138 03/20/2023   K 3.8 03/20/2023   CL 106 03/20/2023   CO2 26 03/20/2023   GLUCOSE 99 03/20/2023   BUN 11 03/20/2023   CREATININE 0.66 03/20/2023   CALCIUM 9.0 03/20/2023   PROT 6.5 03/20/2023   ALBUMIN 4.3 03/20/2023   AST 11 (L) 03/20/2023   ALT 8 03/20/2023   ALKPHOS 60 03/20/2023   BILITOT 0.4 03/20/2023   GFRNONAA >60 03/20/2023   GFRAA >90 06/15/2013     Medications: I have reviewed the patient's current  medications.   Assessment/Plan: Follicular lymphoma-low-grade, clinical stage III, FLIPI-intermediate risk Bilateral axillary ultrasound 08/29/2023-mildly prominent lymph nodes, right axillary node without a defined preserved fatty hilum Ultrasound-guided biopsy of right axillary lymph node 12/19/2022-follicular lymphoma, overall grade 1 of 3, focal grade 2, Ki-67 less than 10% with areas of under 5% and areas at 15-20%, CD20, BCL6, CD10 positive.  Bcl-2 positive.  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 PET 2024-mild to moderate FDG activity associated with borderline enlarged bilateral axillary, retroperitoneal, mesenteric, right inguinal, and right external iliac nodes, mild diffuse increased uptake in the spleen Cycle 1 Bendamustine/rituximab 01/24/2023 Cycle 2 Bendamustine/rituximab 02/21/2023, day 2 Bendamustine held secondary to fever, arthralgias, and rash Cycle 3 Bendamustine/rituximab 03/20/2023 PCOS multiple miscarriages History of a colon polyp-tubular adenoma Family history of colon and esophagus cancer    Disposition: Adrienne Morgan has completed 2 cycles of Bendamustine/rituximab.  Her overall clinical status has improved.  Cycle 2 was complicated by a rash, fever, and arthralgias/myalgias beginning on the evening of day 1.  It is unclear whether these symptoms were related to Bendamustine or rituximab.  She understands the symptoms could recur, including the possibility of a worsening rash with further treatment.  She agrees to proceed with cycle 3 Bendamustine/rituximab today.  She will call for a rash or recurrent flulike symptoms.  She will take Tylenol and Benadryl as needed.  She will return for a nadir CBC in 2 weeks.  The neutrophil count is borderline low today.  The plan is to schedule a restaging PET after cycle 4.  Adrienne Morgan will return for an office visit and cycle 4 chemotherapy in 4 weeks.  Thornton Papas, MD  03/20/2023  2:06 PM

## 2023-03-20 NOTE — Progress Notes (Addendum)
Patient seen by Dr. Truett Perna today  Vitals are within treatment parameters.  Labs reviewed by Dr. Truett Perna and are within treatment parameters.  Per physician team, patient is ready to treat with the following changes: Has decided to add Pepcid to premeds.

## 2023-03-20 NOTE — Patient Instructions (Signed)
[facility]   Discharge Instructions: Thank you for choosing [facility] to provide your oncology and hematology care.   If you have a lab appointment with the Cancer Center, please go directly to the Cancer Center and check in at the registration area.   Wear comfortable clothing and clothing appropriate for easy access to any Portacath or PICC line.   We strive to give you quality time with your provider. You may need to reschedule your appointment if you arrive late (15 or more minutes).  Arriving late affects you and other patients whose appointments are after yours.  Also, if you miss three or more appointments without notifying the office, you may be dismissed from the clinic at the provider's discretion.      For prescription refill requests, have your pharmacy contact our office and allow 72 hours for refills to be completed.    Today you received the following chemotherapy and/or immunotherapy agents Rituximab-pvvr, Bendeka      To help prevent nausea and vomiting after your treatment, we encourage you to take your nausea medication as directed.  BELOW ARE SYMPTOMS THAT SHOULD BE REPORTED IMMEDIATELY: *FEVER GREATER THAN 100.4 F (38 C) OR HIGHER *CHILLS OR SWEATING *NAUSEA AND VOMITING THAT IS NOT CONTROLLED WITH YOUR NAUSEA MEDICATION *UNUSUAL SHORTNESS OF BREATH *UNUSUAL BRUISING OR BLEEDING *URINARY PROBLEMS (pain or burning when urinating, or frequent urination) *BOWEL PROBLEMS (unusual diarrhea, constipation, pain near the anus) TENDERNESS IN MOUTH AND THROAT WITH OR WITHOUT PRESENCE OF ULCERS (sore throat, sores in mouth, or a toothache) UNUSUAL RASH, SWELLING OR PAIN  UNUSUAL VAGINAL DISCHARGE OR ITCHING   Items with * indicate a potential emergency and should be followed up as soon as possible or go to the Emergency Department if any problems should occur.  Please show the CHEMOTHERAPY ALERT CARD or IMMUNOTHERAPY ALERT  CARD at check-in to the Emergency Department and triage nurse.  Should you have questions after your visit or need to cancel or reschedule your appointment, please contact [facility]  Dept: [phone]  and follow the prompts.  Office hours are 8:00 a.m. to 4:30 p.m. Monday - Friday. Please note that voicemails left after 4:00 p.m. may not be returned until the following business day.  We are closed weekends and major holidays. You have access to a nurse at all times for urgent questions. Please call the main number to the clinic Dept: [phone] and follow the prompts.   For any non-urgent questions, you may also contact your provider using MyChart. We now offer e-Visits for anyone 18 and older to request care online for non-urgent symptoms. For details visit [email or web address].   Also download the MyChart app! Go to the app store, search "MyChart", open the app, select [facility], and log in with your MyChart username and password.  Rituximab Injection What is this medication? RITUXIMAB (ri TUX i mab) treats leukemia and lymphoma. It works by blocking a protein that causes cancer cells to grow and multiply. This helps to slow or stop the spread of cancer cells. It may also be used to treat autoimmune conditions, such as arthritis. It works by slowing down an overactive immune system. It is a monoclonal antibody. This medicine may be used for other purposes; ask your health care provider or pharmacist if you have questions. COMMON BRAND NAME(S): RIABNI, Rituxan, RUXIENCE, truxima What should I tell my care team before I take this medication? They need   to know if you have any of these conditions: Chest pain Heart disease Immune system problems Infection, such as chickenpox, cold sores, hepatitis B, herpes Irregular heartbeat or rhythm Kidney disease Low blood counts, such as low white cells, platelets, red cells Lung disease Recent or upcoming  vaccine An unusual or allergic reaction to rituximab, other medications, foods, dyes, or preservatives Pregnant or trying to get pregnant Breast-feeding How should I use this medication? This medication is injected into a vein. It is given by a care team in a hospital or clinic setting. A special MedGuide will be given to you before each treatment. Be sure to read this information carefully each time. Talk to your care team about the use of this medication in children. While this medication may be prescribed for children as young as 6 months for selected conditions, precautions do apply. Overdosage: If you think you have taken too much of this medicine contact a poison control center or emergency room at once. NOTE: This medicine is only for you. Do not share this medicine with others. What if I miss a dose? Keep appointments for follow-up doses. It is important not to miss your dose. Call your care team if you are unable to keep an appointment. What may interact with this medication? Do not take this medication with any of the following: Live vaccines This medication may also interact with the following: Cisplatin This list may not describe all possible interactions. Give your health care provider a list of all the medicines, herbs, non-prescription drugs, or dietary supplements you use. Also tell them if you smoke, drink alcohol, or use illegal drugs. Some items may interact with your medicine. What should I watch for while using this medication? Your condition will be monitored carefully while you are receiving this medication. You may need blood work while taking this medication. This medication can cause serious infusion reactions. To reduce the risk your care team may give you other medications to take before receiving this one. Be sure to follow the directions from your care team. This medication may increase your risk of getting an infection. Call your care team for advice if you get a  fever, chills, sore throat, or other symptoms of a cold or flu. Do not treat yourself. Try to avoid being around people who are sick. Call your care team if you are around anyone with measles, chickenpox, or if you develop sores or blisters that do not heal properly. Avoid taking medications that contain aspirin, acetaminophen, ibuprofen, naproxen, or ketoprofen unless instructed by your care team. These medications may hide a fever. This medication may cause serious skin reactions. They can happen weeks to months after starting the medication. Contact your care team right away if you notice fevers or flu-like symptoms with a rash. The rash may be red or purple and then turn into blisters or peeling of the skin. You may also notice a red rash with swelling of the face, lips, or lymph nodes in your neck or under your arms. In some patients, this medication may cause a serious brain infection that may cause death. If you have any problems seeing, thinking, speaking, walking, or standing, tell your care team right away. If you cannot reach your care team, urgently seek another source of medical care. Talk to your care team if you may be pregnant. Serious birth defects can occur if you take this medication during pregnancy and for 12 months after the last dose. You will need a negative pregnancy   test before starting this medication. Contraception is recommended while taking this medication and for 12 months after the last dose. Your care team can help you find the option that works for you. Do not breastfeed while taking this medication and for at least 6 months after the last dose. What side effects may I notice from receiving this medication? Side effects that you should report to your care team as soon as possible: Allergic reactions or angioedema--skin rash, itching or hives, swelling of the face, eyes, lips, tongue, arms, or legs, trouble swallowing or breathing Bowel blockage--stomach cramping, unable to  have a bowel movement or pass gas, loss of appetite, vomiting Dizziness, loss of balance or coordination, confusion or trouble speaking Heart attack--pain or tightness in the chest, shoulders, arms, or jaw, nausea, shortness of breath, cold or clammy skin, feeling faint or lightheaded Heart rhythm changes--fast or irregular heartbeat, dizziness, feeling faint or lightheaded, chest pain, trouble breathing Infection--fever, chills, cough, sore throat, wounds that don't heal, pain or trouble when passing urine, general feeling of discomfort or being unwell Infusion reactions--chest pain, shortness of breath or trouble breathing, feeling faint or lightheaded Kidney injury--decrease in the amount of urine, swelling of the ankles, hands, or feet Liver injury--right upper belly pain, loss of appetite, nausea, light-colored stool, dark yellow or brown urine, yellowing skin or eyes, unusual weakness or fatigue Redness, blistering, peeling, or loosening of the skin, including inside the mouth Stomach pain that is severe, does not go away, or gets worse Tumor lysis syndrome (TLS)--nausea, vomiting, diarrhea, decrease in the amount of urine, dark urine, unusual weakness or fatigue, confusion, muscle pain or cramps, fast or irregular heartbeat, joint pain Side effects that usually do not require medical attention (report to your care team if they continue or are bothersome): Headache Joint pain Nausea Runny or stuffy nose Unusual weakness or fatigue This list may not describe all possible side effects. Call your doctor for medical advice about side effects. You may report side effects to FDA at 1-800-FDA-1088. Where should I keep my medication? This medication is given in a hospital or clinic. It will not be stored at home. NOTE: This sheet is a summary. It may not cover all possible information. If you have questions about this medicine, talk to your doctor, pharmacist, or health care provider.  2023  Elsevier/Gold Standard (2022-02-07 00:00:00) Bendamustine Injection What is this medication? BENDAMUSTINE (BEN da MUS teen) treats leukemia and lymphoma. It works by slowing down the growth of cancer cells. This medicine may be used for other purposes; ask your health care provider or pharmacist if you have questions. COMMON BRAND NAME(S): BELRAPZO, BENDEKA, Treanda, VIVIMUSTA What should I tell my care team before I take this medication? They need to know if you have any of these conditions: Infection, especially a viral infection, such as chickenpox, cold sores, herpes Kidney disease Liver disease An unusual or allergic reaction to bendamustine, mannitol, other medications, foods, dyes, or preservatives Pregnant or trying to get pregnant Breast-feeding How should I use this medication? This medication is injected into a vein. It is given by your care team in a hospital or clinic setting. Talk to your care team about the use of this medication in children. Special care may be needed. Overdosage: If you think you have taken too much of this medicine contact a poison control center or emergency room at once. NOTE: This medicine is only for you. Do not share this medicine with others. What if I miss a dose?   Keep appointments for follow-up doses. It is important not to miss your dose. Call your care team if you are unable to keep an appointment. What may interact with this medication? Do not take this medication with any of the following: Clozapine This medication may also interact with the following: Atazanavir Cimetidine Ciprofloxacin Enoxacin Fluvoxamine Medications for seizures, such as carbamazepine, phenobarbital Mexiletine Rifampin Tacrine Thiabendazole Zileuton This list may not describe all possible interactions. Give your health care provider a list of all the medicines, herbs, non-prescription drugs, or dietary supplements you use. Also tell them if you smoke, drink  alcohol, or use illegal drugs. Some items may interact with your medicine. What should I watch for while using this medication? Visit your care team for regular checks on your progress. This medication may make you feel generally unwell. This is not uncommon, as chemotherapy can affect healthy cells as well as cancer cells. Report any side effects. Continue your course of treatment even though you feel ill unless your care team tells you to stop. You may need blood work while taking this medication. This medication may increase your risk of getting an infection. Call your care team for advice if you get a fever, chills, sore throat, or other symptoms of a cold or flu. Do not treat yourself. Try to avoid being around people who are sick. This medication may cause serious skin reactions. They can happen weeks to months after starting the medication. Contact your care team right away if you notice fevers or flu-like symptoms with a rash. The rash may be red or purple and then turn into blisters or peeling of the skin. You may also notice a red rash with swelling of the face, lips, or lymph nodes in your neck or under your arms. In some patients, this medication may cause a serious brain infection that may cause death. If you have any problems seeing, thinking, speaking, walking, or standing, tell your care team right away. If you cannot reach your care team, urgently seek other source of medical care. This medication may increase your risk to bruise or bleed. Call your care team if you notice any unusual bleeding. Talk to your care team about your risk of cancer. You may be more at risk for certain types of cancer if you take this medication. Talk to your care team about your risk of skin cancer. You may be more at risk for skin cancer if you take this medication. Talk to your care team if you or your partner wish to become pregnant or think either of you might be pregnant. This medication can cause serious  birth defects if taken during pregnancy or for up to 6 months after the last dose. A negative pregnancy test is required before starting this medication. A reliable form of contraception is recommended while taking this medication and for 6 months after the last dose. Talk to your care team about reliable forms of contraception. Wear a condom while taking this medication and for at least 3 months after the last dose. Do not breast-feed while taking this medication or for at least 1 week after the last dose. This medication may cause infertility. Talk to your care team if you are concerned about your fertility. What side effects may I notice from receiving this medication? Side effects that you should report to your care team as soon as possible: Allergic reactions--skin rash, itching, hives, swelling of the face, lips, tongue, or throat Infection--fever, chills, cough, sore throat, wounds that   don't heal, pain or trouble when passing urine, general feeling of discomfort or being unwell Infusion reactions--chest pain, shortness of breath or trouble breathing, feeling faint or lightheaded Liver injury--right upper belly pain, loss of appetite, nausea, light-colored stool, dark yellow or brown urine, yellowing skin or eyes, unusual weakness or fatigue Low red blood cell level--unusual weakness or fatigue, dizziness, headache, trouble breathing Painful swelling, warmth, or redness of the skin, blisters or sores at the infusion site Rash, fever, and swollen lymph nodes Redness, blistering, peeling, or loosening of the skin, including inside the mouth Tumor lysis syndrome (TLS)--nausea, vomiting, diarrhea, decrease in the amount of urine, dark urine, unusual weakness or fatigue, confusion, muscle pain or cramps, fast or irregular heartbeat, joint pain Unusual bruising or bleeding Side effects that usually do not require medical attention (report to your care team if they continue or are  bothersome): Diarrhea Fatigue Headache Loss of appetite Nausea Vomiting This list may not describe all possible side effects. Call your doctor for medical advice about side effects. You may report side effects to FDA at 1-800-FDA-1088. Where should I keep my medication? This medication is given in a hospital or clinic. It will not be stored at home. NOTE: This sheet is a summary. It may not cover all possible information. If you have questions about this medicine, talk to your doctor, pharmacist, or health care provider.  2023 Elsevier/Gold Standard (2022-01-02 00:00:00)   

## 2023-03-21 ENCOUNTER — Inpatient Hospital Stay: Payer: BC Managed Care – PPO

## 2023-03-21 ENCOUNTER — Other Ambulatory Visit: Payer: Self-pay

## 2023-03-21 VITALS — BP 99/63 | HR 62 | Temp 98.4°F | Resp 18

## 2023-03-21 DIAGNOSIS — C8299 Follicular lymphoma, unspecified, extranodal and solid organ sites: Secondary | ICD-10-CM

## 2023-03-21 DIAGNOSIS — Z5111 Encounter for antineoplastic chemotherapy: Secondary | ICD-10-CM | POA: Diagnosis not present

## 2023-03-21 DIAGNOSIS — E282 Polycystic ovarian syndrome: Secondary | ICD-10-CM | POA: Diagnosis not present

## 2023-03-21 DIAGNOSIS — C8204 Follicular lymphoma grade I, lymph nodes of axilla and upper limb: Secondary | ICD-10-CM | POA: Diagnosis not present

## 2023-03-21 DIAGNOSIS — Z79899 Other long term (current) drug therapy: Secondary | ICD-10-CM | POA: Diagnosis not present

## 2023-03-21 DIAGNOSIS — Z800 Family history of malignant neoplasm of digestive organs: Secondary | ICD-10-CM | POA: Diagnosis not present

## 2023-03-21 DIAGNOSIS — Z8719 Personal history of other diseases of the digestive system: Secondary | ICD-10-CM | POA: Diagnosis not present

## 2023-03-21 DIAGNOSIS — Z86010 Personal history of colonic polyps: Secondary | ICD-10-CM | POA: Diagnosis not present

## 2023-03-21 MED ORDER — SODIUM CHLORIDE 0.9 % IV SOLN
10.0000 mg | Freq: Once | INTRAVENOUS | Status: AC
  Administered 2023-03-21: 10 mg via INTRAVENOUS
  Filled 2023-03-21: qty 10

## 2023-03-21 MED ORDER — SODIUM CHLORIDE 0.9 % IV SOLN
90.0000 mg/m2 | Freq: Once | INTRAVENOUS | Status: AC
  Administered 2023-03-21: 150 mg via INTRAVENOUS
  Filled 2023-03-21: qty 6

## 2023-03-21 MED ORDER — FAMOTIDINE IN NACL 20-0.9 MG/50ML-% IV SOLN
20.0000 mg | Freq: Once | INTRAVENOUS | Status: AC
  Administered 2023-03-21: 20 mg via INTRAVENOUS
  Filled 2023-03-21: qty 50

## 2023-03-21 MED ORDER — SODIUM CHLORIDE 0.9 % IV SOLN: Freq: Once | INTRAVENOUS | Status: AC

## 2023-03-21 NOTE — Patient Instructions (Signed)
[facility]  Discharge Instructions: Thank you for choosing [facility] to provide your oncology and hematology care.   If you have a lab appointment with the Cancer Center, please go directly to the Cancer Center and check in at the registration area.   Wear comfortable clothing and clothing appropriate for easy access to any Portacath or PICC line.   We strive to give you quality time with your provider. You may need to reschedule your appointment if you arrive late (15 or more minutes).  Arriving late affects you and other patients whose appointments are after yours.  Also, if you miss three or more appointments without notifying the office, you may be dismissed from the clinic at the provider's discretion.      For prescription refill requests, have your pharmacy contact our office and allow 72 hours for refills to be completed.    Today you received the following chemotherapy and/or immunotherapy agents: bendamustine      To help prevent nausea and vomiting after your treatment, we encourage you to take your nausea medication as directed.  BELOW ARE SYMPTOMS THAT SHOULD BE REPORTED IMMEDIATELY: *FEVER GREATER THAN 100.4 F (38 C) OR HIGHER *CHILLS OR SWEATING *NAUSEA AND VOMITING THAT IS NOT CONTROLLED WITH YOUR NAUSEA MEDICATION *UNUSUAL SHORTNESS OF BREATH *UNUSUAL BRUISING OR BLEEDING *URINARY PROBLEMS (pain or burning when urinating, or frequent urination) *BOWEL PROBLEMS (unusual diarrhea, constipation, pain near the anus) TENDERNESS IN MOUTH AND THROAT WITH OR WITHOUT PRESENCE OF ULCERS (sore throat, sores in mouth, or a toothache) UNUSUAL RASH, SWELLING OR PAIN  UNUSUAL VAGINAL DISCHARGE OR ITCHING   Items with * indicate a potential emergency and should be followed up as soon as possible or go to the Emergency Department if any problems should occur.  Please show the CHEMOTHERAPY ALERT CARD or IMMUNOTHERAPY ALERT CARD at  check-in to the Emergency Department and triage nurse.  Should you have questions after your visit or need to cancel or reschedule your appointment, please contact [facility]  Dept: [phone]  and follow the prompts.  Office hours are 8:00 a.m. to 4:30 p.m. Monday - Friday. Please note that voicemails left after 4:00 p.m. may not be returned until the following business day.  We are closed weekends and major holidays. You have access to a nurse at all times for urgent questions. Please call the main number to the clinic Dept: [phone] and follow the prompts.   For any non-urgent questions, you may also contact your provider using MyChart. We now offer e-Visits for anyone 18 and older to request care online for non-urgent symptoms. For details visit [email or web address].   Also download the MyChart app! Go to the app store, search "MyChart", open the app, select [place], and log in with your MyChart username and password.  Bendamustine Injection What is this medication? BENDAMUSTINE (BEN da MUS teen) treats leukemia and lymphoma. It works by slowing down the growth of cancer cells. This medicine may be used for other purposes; ask your health care provider or pharmacist if you have questions. COMMON BRAND NAME(S): BELRAPZO, BENDEKA, Treanda, VIVIMUSTA What should I tell my care team before I take this medication? They need to know if you have any of these conditions: Infection, especially a viral infection, such as chickenpox, cold sores, herpes Kidney disease Liver disease An unusual or allergic reaction to bendamustine, mannitol, other medications, foods, dyes, or preservatives Pregnant or trying to get   pregnant Breast-feeding How should I use this medication? This medication is injected into a vein. It is given by your care team in a hospital or clinic setting. Talk to your care team about the use of this medication in children. Special care  may be needed. Overdosage: If you think you have taken too much of this medicine contact a poison control center or emergency room at once. NOTE: This medicine is only for you. Do not share this medicine with others. What if I miss a dose? Keep appointments for follow-up doses. It is important not to miss your dose. Call your care team if you are unable to keep an appointment. What may interact with this medication? Do not take this medication with any of the following: Clozapine This medication may also interact with the following: Atazanavir Cimetidine Ciprofloxacin Enoxacin Fluvoxamine Medications for seizures, such as carbamazepine, phenobarbital Mexiletine Rifampin Tacrine Thiabendazole Zileuton This list may not describe all possible interactions. Give your health care provider a list of all the medicines, herbs, non-prescription drugs, or dietary supplements you use. Also tell them if you smoke, drink alcohol, or use illegal drugs. Some items may interact with your medicine. What should I watch for while using this medication? Visit your care team for regular checks on your progress. This medication may make you feel generally unwell. This is not uncommon, as chemotherapy can affect healthy cells as well as cancer cells. Report any side effects. Continue your course of treatment even though you feel ill unless your care team tells you to stop. You may need blood work while taking this medication. This medication may increase your risk of getting an infection. Call your care team for advice if you get a fever, chills, sore throat, or other symptoms of a cold or flu. Do not treat yourself. Try to avoid being around people who are sick. This medication may cause serious skin reactions. They can happen weeks to months after starting the medication. Contact your care team right away if you notice fevers or flu-like symptoms with a rash. The rash may be red or purple and then turn into  blisters or peeling of the skin. You may also notice a red rash with swelling of the face, lips, or lymph nodes in your neck or under your arms. In some patients, this medication may cause a serious brain infection that may cause death. If you have any problems seeing, thinking, speaking, walking, or standing, tell your care team right away. If you cannot reach your care team, urgently seek other source of medical care. This medication may increase your risk to bruise or bleed. Call your care team if you notice any unusual bleeding. Talk to your care team about your risk of cancer. You may be more at risk for certain types of cancer if you take this medication. Talk to your care team about your risk of skin cancer. You may be more at risk for skin cancer if you take this medication. Talk to your care team if you or your partner wish to become pregnant or think either of you might be pregnant. This medication can cause serious birth defects if taken during pregnancy or for up to 6 months after the last dose. A negative pregnancy test is required before starting this medication. A reliable form of contraception is recommended while taking this medication and for 6 months after the last dose. Talk to your care team about reliable forms of contraception. Wear a condom while taking this medication   and for at least 3 months after the last dose. Do not breast-feed while taking this medication or for at least 1 week after the last dose. This medication may cause infertility. Talk to your care team if you are concerned about your fertility. What side effects may I notice from receiving this medication? Side effects that you should report to your care team as soon as possible: Allergic reactions--skin rash, itching, hives, swelling of the face, lips, tongue, or throat Infection--fever, chills, cough, sore throat, wounds that don't heal, pain or trouble when passing urine, general feeling of discomfort or being  unwell Infusion reactions--chest pain, shortness of breath or trouble breathing, feeling faint or lightheaded Liver injury--right upper belly pain, loss of appetite, nausea, light-colored stool, dark yellow or brown urine, yellowing skin or eyes, unusual weakness or fatigue Low red blood cell level--unusual weakness or fatigue, dizziness, headache, trouble breathing Painful swelling, warmth, or redness of the skin, blisters or sores at the infusion site Rash, fever, and swollen lymph nodes Redness, blistering, peeling, or loosening of the skin, including inside the mouth Tumor lysis syndrome (TLS)--nausea, vomiting, diarrhea, decrease in the amount of urine, dark urine, unusual weakness or fatigue, confusion, muscle pain or cramps, fast or irregular heartbeat, joint pain Unusual bruising or bleeding Side effects that usually do not require medical attention (report to your care team if they continue or are bothersome): Diarrhea Fatigue Headache Loss of appetite Nausea Vomiting This list may not describe all possible side effects. Call your doctor for medical advice about side effects. You may report side effects to FDA at 1-800-FDA-1088. Where should I keep my medication? This medication is given in a hospital or clinic. It will not be stored at home. NOTE: This sheet is a summary. It may not cover all possible information. If you have questions about this medicine, talk to your doctor, pharmacist, or health care provider.  2024 [employer]/Gold Standard (2022-01-17 00:00:00)   

## 2023-03-27 ENCOUNTER — Other Ambulatory Visit: Payer: BC Managed Care – PPO

## 2023-03-27 NOTE — Progress Notes (Signed)
Health Care Provider Certification for FMLA completed and faxed with confirmation.

## 2023-04-03 ENCOUNTER — Inpatient Hospital Stay: Payer: BC Managed Care – PPO

## 2023-04-03 DIAGNOSIS — Z800 Family history of malignant neoplasm of digestive organs: Secondary | ICD-10-CM | POA: Diagnosis not present

## 2023-04-03 DIAGNOSIS — Z8719 Personal history of other diseases of the digestive system: Secondary | ICD-10-CM | POA: Diagnosis not present

## 2023-04-03 DIAGNOSIS — C8299 Follicular lymphoma, unspecified, extranodal and solid organ sites: Secondary | ICD-10-CM

## 2023-04-03 DIAGNOSIS — Z5111 Encounter for antineoplastic chemotherapy: Secondary | ICD-10-CM | POA: Diagnosis not present

## 2023-04-03 DIAGNOSIS — Z79899 Other long term (current) drug therapy: Secondary | ICD-10-CM | POA: Diagnosis not present

## 2023-04-03 DIAGNOSIS — E282 Polycystic ovarian syndrome: Secondary | ICD-10-CM | POA: Diagnosis not present

## 2023-04-03 DIAGNOSIS — C8204 Follicular lymphoma grade I, lymph nodes of axilla and upper limb: Secondary | ICD-10-CM | POA: Diagnosis not present

## 2023-04-03 DIAGNOSIS — Z86010 Personal history of colonic polyps: Secondary | ICD-10-CM | POA: Diagnosis not present

## 2023-04-03 LAB — CBC WITH DIFFERENTIAL (CANCER CENTER ONLY)
Abs Immature Granulocytes: 0.02 10*3/uL (ref 0.00–0.07)
Basophils Absolute: 0 10*3/uL (ref 0.0–0.1)
Basophils Relative: 1 %
Eosinophils Absolute: 0.1 10*3/uL (ref 0.0–0.5)
Eosinophils Relative: 2 %
HCT: 38.4 % (ref 36.0–46.0)
Hemoglobin: 12.8 g/dL (ref 12.0–15.0)
Immature Granulocytes: 1 %
Lymphocytes Relative: 6 %
Lymphs Abs: 0.2 10*3/uL — ABNORMAL LOW (ref 0.7–4.0)
MCH: 31.4 pg (ref 26.0–34.0)
MCHC: 33.3 g/dL (ref 30.0–36.0)
MCV: 94.1 fL (ref 80.0–100.0)
Monocytes Absolute: 0.7 10*3/uL (ref 0.1–1.0)
Monocytes Relative: 17 %
Neutro Abs: 2.8 10*3/uL (ref 1.7–7.7)
Neutrophils Relative %: 73 %
Platelet Count: 248 10*3/uL (ref 150–400)
RBC: 4.08 MIL/uL (ref 3.87–5.11)
RDW: 13 % (ref 11.5–15.5)
WBC Count: 3.9 10*3/uL — ABNORMAL LOW (ref 4.0–10.5)
nRBC: 0 % (ref 0.0–0.2)

## 2023-04-15 ENCOUNTER — Other Ambulatory Visit: Payer: Self-pay | Admitting: Oncology

## 2023-04-18 ENCOUNTER — Inpatient Hospital Stay: Payer: BC Managed Care – PPO | Attending: Oncology

## 2023-04-18 ENCOUNTER — Inpatient Hospital Stay: Payer: BC Managed Care – PPO

## 2023-04-18 ENCOUNTER — Inpatient Hospital Stay: Payer: BC Managed Care – PPO | Admitting: Oncology

## 2023-04-18 ENCOUNTER — Encounter: Payer: Self-pay | Admitting: *Deleted

## 2023-04-18 VITALS — BP 99/62 | HR 78 | Temp 98.2°F | Resp 18

## 2023-04-18 VITALS — BP 119/68 | HR 69 | Temp 98.2°F | Resp 18 | Wt 137.6 lb

## 2023-04-18 DIAGNOSIS — C8204 Follicular lymphoma grade I, lymph nodes of axilla and upper limb: Secondary | ICD-10-CM | POA: Diagnosis not present

## 2023-04-18 DIAGNOSIS — Z86010 Personal history of colonic polyps: Secondary | ICD-10-CM | POA: Insufficient documentation

## 2023-04-18 DIAGNOSIS — C8299 Follicular lymphoma, unspecified, extranodal and solid organ sites: Secondary | ICD-10-CM

## 2023-04-18 DIAGNOSIS — Z800 Family history of malignant neoplasm of digestive organs: Secondary | ICD-10-CM | POA: Diagnosis not present

## 2023-04-18 DIAGNOSIS — Z79899 Other long term (current) drug therapy: Secondary | ICD-10-CM | POA: Insufficient documentation

## 2023-04-18 DIAGNOSIS — Z5111 Encounter for antineoplastic chemotherapy: Secondary | ICD-10-CM | POA: Insufficient documentation

## 2023-04-18 DIAGNOSIS — E282 Polycystic ovarian syndrome: Secondary | ICD-10-CM | POA: Insufficient documentation

## 2023-04-18 LAB — CMP (CANCER CENTER ONLY)
ALT: 12 U/L (ref 0–44)
AST: 17 U/L (ref 15–41)
Albumin: 4.8 g/dL (ref 3.5–5.0)
Alkaline Phosphatase: 72 U/L (ref 38–126)
Anion gap: 8 (ref 5–15)
BUN: 10 mg/dL (ref 6–20)
CO2: 28 mmol/L (ref 22–32)
Calcium: 10 mg/dL (ref 8.9–10.3)
Chloride: 103 mmol/L (ref 98–111)
Creatinine: 0.75 mg/dL (ref 0.44–1.00)
GFR, Estimated: >60 mL/min (ref >60)
Glucose, Bld: 110 mg/dL — ABNORMAL HIGH (ref 70–99)
Potassium: 4.5 mmol/L (ref 3.5–5.1)
Sodium: 139 mmol/L (ref 135–145)
Total Bilirubin: 0.6 mg/dL (ref 0.3–1.2)
Total Protein: 7.2 g/dL (ref 6.5–8.1)

## 2023-04-18 LAB — CBC WITH DIFFERENTIAL (CANCER CENTER ONLY)
Abs Immature Granulocytes: 0.01 10*3/uL (ref 0.00–0.07)
Basophils Absolute: 0 10*3/uL (ref 0.0–0.1)
Basophils Relative: 1 %
Eosinophils Absolute: 0.1 10*3/uL (ref 0.0–0.5)
Eosinophils Relative: 3 %
HCT: 39.6 % (ref 36.0–46.0)
Hemoglobin: 13.4 g/dL (ref 12.0–15.0)
Immature Granulocytes: 0 %
Lymphocytes Relative: 10 %
Lymphs Abs: 0.3 10*3/uL — ABNORMAL LOW (ref 0.7–4.0)
MCH: 31.8 pg (ref 26.0–34.0)
MCHC: 33.8 g/dL (ref 30.0–36.0)
MCV: 93.8 fL (ref 80.0–100.0)
Monocytes Absolute: 0.6 10*3/uL (ref 0.1–1.0)
Monocytes Relative: 17 %
Neutro Abs: 2.2 10*3/uL (ref 1.7–7.7)
Neutrophils Relative %: 69 %
Platelet Count: 123 10*3/uL — ABNORMAL LOW (ref 150–400)
RBC: 4.22 MIL/uL (ref 3.87–5.11)
RDW: 12.7 % (ref 11.5–15.5)
WBC Count: 3.2 10*3/uL — ABNORMAL LOW (ref 4.0–10.5)
nRBC: 0 % (ref 0.0–0.2)

## 2023-04-18 LAB — PREGNANCY, URINE: Preg Test, Ur: NEGATIVE

## 2023-04-18 MED ORDER — SODIUM CHLORIDE 0.9 % IV SOLN
10.0000 mg | Freq: Once | INTRAVENOUS | Status: AC
  Administered 2023-04-18: 10 mg via INTRAVENOUS
  Filled 2023-04-18: qty 10

## 2023-04-18 MED ORDER — PALONOSETRON HCL INJECTION 0.25 MG/5ML
0.2500 mg | Freq: Once | INTRAVENOUS | Status: AC
  Administered 2023-04-18: 0.25 mg via INTRAVENOUS
  Filled 2023-04-18: qty 5

## 2023-04-18 MED ORDER — SODIUM CHLORIDE 0.9 % IV SOLN
375.0000 mg/m2 | Freq: Once | INTRAVENOUS | Status: AC
  Administered 2023-04-18: 600 mg via INTRAVENOUS
  Filled 2023-04-18: qty 50

## 2023-04-18 MED ORDER — FAMOTIDINE IN NACL 20-0.9 MG/50ML-% IV SOLN
20.0000 mg | Freq: Once | INTRAVENOUS | Status: AC
  Administered 2023-04-18: 20 mg via INTRAVENOUS
  Filled 2023-04-18: qty 50

## 2023-04-18 MED ORDER — ACETAMINOPHEN 325 MG PO TABS
650.0000 mg | ORAL_TABLET | Freq: Once | ORAL | Status: AC
  Administered 2023-04-18: 650 mg via ORAL
  Filled 2023-04-18: qty 2

## 2023-04-18 MED ORDER — SODIUM CHLORIDE 0.9 % IV SOLN
90.0000 mg/m2 | Freq: Once | INTRAVENOUS | Status: AC
  Administered 2023-04-18: 150 mg via INTRAVENOUS
  Filled 2023-04-18: qty 6

## 2023-04-18 MED ORDER — SODIUM CHLORIDE 0.9 % IV SOLN: Freq: Once | INTRAVENOUS | Status: AC

## 2023-04-18 MED ORDER — DIPHENHYDRAMINE HCL 25 MG PO CAPS
50.0000 mg | ORAL_CAPSULE | Freq: Once | ORAL | Status: AC
  Administered 2023-04-18: 50 mg via ORAL
  Filled 2023-04-18: qty 2

## 2023-04-18 NOTE — Progress Notes (Signed)
Patient seen by Dr. Truett Perna today  Vitals are within treatment parameters.  Labs reviewed by Dr. Truett Perna and are within treatment parameters.  Per physician team, ready for treatment with no modifications

## 2023-04-18 NOTE — Patient Instructions (Signed)
[facility]   Discharge Instructions: Thank you for choosing [facility] to provide your oncology and hematology care.   If you have a lab appointment with the Cancer Center, please go directly to the Cancer Center and check in at the registration area.   Wear comfortable clothing and clothing appropriate for easy access to any Portacath or PICC line.   We strive to give you quality time with your provider. You may need to reschedule your appointment if you arrive late (15 or more minutes).  Arriving late affects you and other patients whose appointments are after yours.  Also, if you miss three or more appointments without notifying the office, you may be dismissed from the clinic at the provider's discretion.      For prescription refill requests, have your pharmacy contact our office and allow 72 hours for refills to be completed.    Today you received the following chemotherapy and/or immunotherapy agents Rituximab-pvvr, Bendeka      To help prevent nausea and vomiting after your treatment, we encourage you to take your nausea medication as directed.  BELOW ARE SYMPTOMS THAT SHOULD BE REPORTED IMMEDIATELY: *FEVER GREATER THAN 100.4 F (38 C) OR HIGHER *CHILLS OR SWEATING *NAUSEA AND VOMITING THAT IS NOT CONTROLLED WITH YOUR NAUSEA MEDICATION *UNUSUAL SHORTNESS OF BREATH *UNUSUAL BRUISING OR BLEEDING *URINARY PROBLEMS (pain or burning when urinating, or frequent urination) *BOWEL PROBLEMS (unusual diarrhea, constipation, pain near the anus) TENDERNESS IN MOUTH AND THROAT WITH OR WITHOUT PRESENCE OF ULCERS (sore throat, sores in mouth, or a toothache) UNUSUAL RASH, SWELLING OR PAIN  UNUSUAL VAGINAL DISCHARGE OR ITCHING   Items with * indicate a potential emergency and should be followed up as soon as possible or go to the Emergency Department if any problems should occur.  Please show the CHEMOTHERAPY ALERT CARD or IMMUNOTHERAPY ALERT  CARD at check-in to the Emergency Department and triage nurse.  Should you have questions after your visit or need to cancel or reschedule your appointment, please contact [facility]  Dept: [phone]  and follow the prompts.  Office hours are 8:00 a.m. to 4:30 p.m. Monday - Friday. Please note that voicemails left after 4:00 p.m. may not be returned until the following business day.  We are closed weekends and major holidays. You have access to a nurse at all times for urgent questions. Please call the main number to the clinic Dept: [phone] and follow the prompts.   For any non-urgent questions, you may also contact your provider using MyChart. We now offer e-Visits for anyone 18 and older to request care online for non-urgent symptoms. For details visit [email or web address].   Also download the MyChart app! Go to the app store, search "MyChart", open the app, select [place], and log in with your MyChart username and password.  Rituximab Injection What is this medication? RITUXIMAB (ri TUX i mab) treats leukemia and lymphoma. It works by blocking a protein that causes cancer cells to grow and multiply. This helps to slow or stop the spread of cancer cells. It may also be used to treat autoimmune conditions, such as arthritis. It works by slowing down an overactive immune system. It is a monoclonal antibody. This medicine may be used for other purposes; ask your health care provider or pharmacist if you have questions. COMMON BRAND NAME(S): RIABNI, Rituxan, RUXIENCE, truxima What should I tell my care team before I take this medication? They need   to know if you have any of these conditions: Chest pain Heart disease Immune system problems Infection, such as chickenpox, cold sores, hepatitis B, herpes Irregular heartbeat or rhythm Kidney disease Low blood counts, such as low white cells, platelets, red cells Lung disease Recent or upcoming  vaccine An unusual or allergic reaction to rituximab, other medications, foods, dyes, or preservatives Pregnant or trying to get pregnant Breast-feeding How should I use this medication? This medication is injected into a vein. It is given by a care team in a hospital or clinic setting. A special MedGuide will be given to you before each treatment. Be sure to read this information carefully each time. Talk to your care team about the use of this medication in children. While this medication may be prescribed for children as young as 6 months for selected conditions, precautions do apply. Overdosage: If you think you have taken too much of this medicine contact a poison control center or emergency room at once. NOTE: This medicine is only for you. Do not share this medicine with others. What if I miss a dose? Keep appointments for follow-up doses. It is important not to miss your dose. Call your care team if you are unable to keep an appointment. What may interact with this medication? Do not take this medication with any of the following: Live vaccines This medication may also interact with the following: Cisplatin This list may not describe all possible interactions. Give your health care provider a list of all the medicines, herbs, non-prescription drugs, or dietary supplements you use. Also tell them if you smoke, drink alcohol, or use illegal drugs. Some items may interact with your medicine. What should I watch for while using this medication? Your condition will be monitored carefully while you are receiving this medication. You may need blood work while taking this medication. This medication can cause serious infusion reactions. To reduce the risk your care team may give you other medications to take before receiving this one. Be sure to follow the directions from your care team. This medication may increase your risk of getting an infection. Call your care team for advice if you get a  fever, chills, sore throat, or other symptoms of a cold or flu. Do not treat yourself. Try to avoid being around people who are sick. Call your care team if you are around anyone with measles, chickenpox, or if you develop sores or blisters that do not heal properly. Avoid taking medications that contain aspirin, acetaminophen, ibuprofen, naproxen, or ketoprofen unless instructed by your care team. These medications may hide a fever. This medication may cause serious skin reactions. They can happen weeks to months after starting the medication. Contact your care team right away if you notice fevers or flu-like symptoms with a rash. The rash may be red or purple and then turn into blisters or peeling of the skin. You may also notice a red rash with swelling of the face, lips, or lymph nodes in your neck or under your arms. In some patients, this medication may cause a serious brain infection that may cause death. If you have any problems seeing, thinking, speaking, walking, or standing, tell your care team right away. If you cannot reach your care team, urgently seek another source of medical care. Talk to your care team if you may be pregnant. Serious birth defects can occur if you take this medication during pregnancy and for 12 months after the last dose. You will need a negative pregnancy   test before starting this medication. Contraception is recommended while taking this medication and for 12 months after the last dose. Your care team can help you find the option that works for you. Do not breastfeed while taking this medication and for at least 6 months after the last dose. What side effects may I notice from receiving this medication? Side effects that you should report to your care team as soon as possible: Allergic reactions or angioedema--skin rash, itching or hives, swelling of the face, eyes, lips, tongue, arms, or legs, trouble swallowing or breathing Bowel blockage--stomach cramping, unable to  have a bowel movement or pass gas, loss of appetite, vomiting Dizziness, loss of balance or coordination, confusion or trouble speaking Heart attack--pain or tightness in the chest, shoulders, arms, or jaw, nausea, shortness of breath, cold or clammy skin, feeling faint or lightheaded Heart rhythm changes--fast or irregular heartbeat, dizziness, feeling faint or lightheaded, chest pain, trouble breathing Infection--fever, chills, cough, sore throat, wounds that don't heal, pain or trouble when passing urine, general feeling of discomfort or being unwell Infusion reactions--chest pain, shortness of breath or trouble breathing, feeling faint or lightheaded Kidney injury--decrease in the amount of urine, swelling of the ankles, hands, or feet Liver injury--right upper belly pain, loss of appetite, nausea, light-colored stool, dark yellow or brown urine, yellowing skin or eyes, unusual weakness or fatigue Redness, blistering, peeling, or loosening of the skin, including inside the mouth Stomach pain that is severe, does not go away, or gets worse Tumor lysis syndrome (TLS)--nausea, vomiting, diarrhea, decrease in the amount of urine, dark urine, unusual weakness or fatigue, confusion, muscle pain or cramps, fast or irregular heartbeat, joint pain Side effects that usually do not require medical attention (report to your care team if they continue or are bothersome): Headache Joint pain Nausea Runny or stuffy nose Unusual weakness or fatigue This list may not describe all possible side effects. Call your doctor for medical advice about side effects. You may report side effects to FDA at 1-800-FDA-1088. Where should I keep my medication? This medication is given in a hospital or clinic. It will not be stored at home. NOTE: This sheet is a summary. It may not cover all possible information. If you have questions about this medicine, talk to your doctor, pharmacist, or health care provider.  2023  Elsevier/Gold Standard (2022-02-07 00:00:00) Bendamustine Injection What is this medication? BENDAMUSTINE (BEN da MUS teen) treats leukemia and lymphoma. It works by slowing down the growth of cancer cells. This medicine may be used for other purposes; ask your health care provider or pharmacist if you have questions. COMMON BRAND NAME(S): BELRAPZO, BENDEKA, Treanda, VIVIMUSTA What should I tell my care team before I take this medication? They need to know if you have any of these conditions: Infection, especially a viral infection, such as chickenpox, cold sores, herpes Kidney disease Liver disease An unusual or allergic reaction to bendamustine, mannitol, other medications, foods, dyes, or preservatives Pregnant or trying to get pregnant Breast-feeding How should I use this medication? This medication is injected into a vein. It is given by your care team in a hospital or clinic setting. Talk to your care team about the use of this medication in children. Special care may be needed. Overdosage: If you think you have taken too much of this medicine contact a poison control center or emergency room at once. NOTE: This medicine is only for you. Do not share this medicine with others. What if I miss a dose?   Keep appointments for follow-up doses. It is important not to miss your dose. Call your care team if you are unable to keep an appointment. What may interact with this medication? Do not take this medication with any of the following: Clozapine This medication may also interact with the following: Atazanavir Cimetidine Ciprofloxacin Enoxacin Fluvoxamine Medications for seizures, such as carbamazepine, phenobarbital Mexiletine Rifampin Tacrine Thiabendazole Zileuton This list may not describe all possible interactions. Give your health care provider a list of all the medicines, herbs, non-prescription drugs, or dietary supplements you use. Also tell them if you smoke, drink  alcohol, or use illegal drugs. Some items may interact with your medicine. What should I watch for while using this medication? Visit your care team for regular checks on your progress. This medication may make you feel generally unwell. This is not uncommon, as chemotherapy can affect healthy cells as well as cancer cells. Report any side effects. Continue your course of treatment even though you feel ill unless your care team tells you to stop. You may need blood work while taking this medication. This medication may increase your risk of getting an infection. Call your care team for advice if you get a fever, chills, sore throat, or other symptoms of a cold or flu. Do not treat yourself. Try to avoid being around people who are sick. This medication may cause serious skin reactions. They can happen weeks to months after starting the medication. Contact your care team right away if you notice fevers or flu-like symptoms with a rash. The rash may be red or purple and then turn into blisters or peeling of the skin. You may also notice a red rash with swelling of the face, lips, or lymph nodes in your neck or under your arms. In some patients, this medication may cause a serious brain infection that may cause death. If you have any problems seeing, thinking, speaking, walking, or standing, tell your care team right away. If you cannot reach your care team, urgently seek other source of medical care. This medication may increase your risk to bruise or bleed. Call your care team if you notice any unusual bleeding. Talk to your care team about your risk of cancer. You may be more at risk for certain types of cancer if you take this medication. Talk to your care team about your risk of skin cancer. You may be more at risk for skin cancer if you take this medication. Talk to your care team if you or your partner wish to become pregnant or think either of you might be pregnant. This medication can cause serious  birth defects if taken during pregnancy or for up to 6 months after the last dose. A negative pregnancy test is required before starting this medication. A reliable form of contraception is recommended while taking this medication and for 6 months after the last dose. Talk to your care team about reliable forms of contraception. Wear a condom while taking this medication and for at least 3 months after the last dose. Do not breast-feed while taking this medication or for at least 1 week after the last dose. This medication may cause infertility. Talk to your care team if you are concerned about your fertility. What side effects may I notice from receiving this medication? Side effects that you should report to your care team as soon as possible: Allergic reactions--skin rash, itching, hives, swelling of the face, lips, tongue, or throat Infection--fever, chills, cough, sore throat, wounds that   don't heal, pain or trouble when passing urine, general feeling of discomfort or being unwell Infusion reactions--chest pain, shortness of breath or trouble breathing, feeling faint or lightheaded Liver injury--right upper belly pain, loss of appetite, nausea, light-colored stool, dark yellow or brown urine, yellowing skin or eyes, unusual weakness or fatigue Low red blood cell level--unusual weakness or fatigue, dizziness, headache, trouble breathing Painful swelling, warmth, or redness of the skin, blisters or sores at the infusion site Rash, fever, and swollen lymph nodes Redness, blistering, peeling, or loosening of the skin, including inside the mouth Tumor lysis syndrome (TLS)--nausea, vomiting, diarrhea, decrease in the amount of urine, dark urine, unusual weakness or fatigue, confusion, muscle pain or cramps, fast or irregular heartbeat, joint pain Unusual bruising or bleeding Side effects that usually do not require medical attention (report to your care team if they continue or are  bothersome): Diarrhea Fatigue Headache Loss of appetite Nausea Vomiting This list may not describe all possible side effects. Call your doctor for medical advice about side effects. You may report side effects to FDA at 1-800-FDA-1088. Where should I keep my medication? This medication is given in a hospital or clinic. It will not be stored at home. NOTE: This sheet is a summary. It may not cover all possible information. If you have questions about this medicine, talk to your doctor, pharmacist, or health care provider.  2023 Elsevier/Gold Standard (2022-01-02 00:00:00)   

## 2023-04-18 NOTE — Progress Notes (Signed)
   Cancer Center OFFICE PROGRESS NOTE   Diagnosis: Non-Hodgkin's lymphoma  INTERVAL HISTORY:   Ms. Virgo completed another cycle of Bendamustine/rituximab beginning on 03/20/2023.  She did not have a rash or arthralgias following this cycle of chemotherapy.  She had 1 lung ulcer that resolved.  No fever or nausea.  She feels well.  No night sweats.  No palpable lymph nodes.  Objective:  Vital signs in last 24 hours:  Blood pressure 119/68, pulse 69, temperature 98.2 F (36.8 C), temperature source Temporal, resp. rate 18, weight 137 lb 9.6 oz (62.4 kg), SpO2 100 %.    HEENT: No thrush or ulcers Lymphatics: No cervical, supraclavicular, axillary, or inguinal nodes Resp: Lungs clear bilaterally Cardio: Regular rate and rhythm GI: No hepatosplenomegaly, no mass, nontender Vascular: No leg edema  Lab Results:  Lab Results  Component Value Date   WBC 3.2 (L) 04/18/2023   HGB 13.4 04/18/2023   HCT 39.6 04/18/2023   MCV 93.8 04/18/2023   PLT 123 (L) 04/18/2023   NEUTROABS 2.2 04/18/2023    CMP  Lab Results  Component Value Date   NA 138 03/20/2023   K 3.8 03/20/2023   CL 106 03/20/2023   CO2 26 03/20/2023   GLUCOSE 99 03/20/2023   BUN 11 03/20/2023   CREATININE 0.66 03/20/2023   CALCIUM 9.0 03/20/2023   PROT 6.5 03/20/2023   ALBUMIN 4.3 03/20/2023   AST 11 (L) 03/20/2023   ALT 8 03/20/2023   ALKPHOS 60 03/20/2023   BILITOT 0.4 03/20/2023   GFRNONAA >60 03/20/2023   GFRAA >90 06/15/2013    Medications: I have reviewed the patient's current medications.   Assessment/Plan: Follicular lymphoma-low-grade, clinical stage III, FLIPI-intermediate risk Bilateral axillary ultrasound 08/29/2023-mildly prominent lymph nodes, right axillary node without a defined preserved fatty hilum Ultrasound-guided biopsy of right axillary lymph node 12/19/2022-follicular lymphoma, overall grade 1 of 3, focal grade 2, Ki-67 less than 10% with areas of under 5% and areas at  15-20%, CD20, BCL6, CD10 positive.  Bcl-2 positive.  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 Fleisher appears stable.  She has completed 3 cycles of Bendamustine/rituximab.  She tolerated the last cycle of chemotherapy well.  She will complete cycle 4 beginning today.  She will return in 2 weeks for a nadir CBC.  She will undergo a restaging PET scan after this cycle.  Palpable lymphadenopathy and night sweats have resolved.  She will return for an office visit with the plan to continue Bendamustine/rituximab on 05/16/2023.  Thornton Papas, MD  04/18/2023  8:35 AM

## 2023-04-19 ENCOUNTER — Telehealth: Payer: Self-pay | Admitting: Oncology

## 2023-04-19 ENCOUNTER — Inpatient Hospital Stay: Payer: BC Managed Care – PPO

## 2023-04-19 VITALS — BP 95/69 | HR 74 | Temp 98.2°F | Resp 18

## 2023-04-19 DIAGNOSIS — E282 Polycystic ovarian syndrome: Secondary | ICD-10-CM | POA: Diagnosis not present

## 2023-04-19 DIAGNOSIS — Z79899 Other long term (current) drug therapy: Secondary | ICD-10-CM | POA: Diagnosis not present

## 2023-04-19 DIAGNOSIS — Z5111 Encounter for antineoplastic chemotherapy: Secondary | ICD-10-CM | POA: Diagnosis not present

## 2023-04-19 DIAGNOSIS — Z800 Family history of malignant neoplasm of digestive organs: Secondary | ICD-10-CM | POA: Diagnosis not present

## 2023-04-19 DIAGNOSIS — Z86010 Personal history of colonic polyps: Secondary | ICD-10-CM | POA: Diagnosis not present

## 2023-04-19 DIAGNOSIS — C8204 Follicular lymphoma grade I, lymph nodes of axilla and upper limb: Secondary | ICD-10-CM | POA: Diagnosis not present

## 2023-04-19 DIAGNOSIS — C8299 Follicular lymphoma, unspecified, extranodal and solid organ sites: Secondary | ICD-10-CM

## 2023-04-19 MED ORDER — FAMOTIDINE IN NACL 20-0.9 MG/50ML-% IV SOLN
20.0000 mg | Freq: Once | INTRAVENOUS | Status: AC
  Administered 2023-04-19: 20 mg via INTRAVENOUS
  Filled 2023-04-19: qty 50

## 2023-04-19 MED ORDER — SODIUM CHLORIDE 0.9 % IV SOLN: Freq: Once | INTRAVENOUS | Status: AC

## 2023-04-19 MED ORDER — SODIUM CHLORIDE 0.9 % IV SOLN
90.0000 mg/m2 | Freq: Once | INTRAVENOUS | Status: AC
  Administered 2023-04-19: 150 mg via INTRAVENOUS
  Filled 2023-04-19: qty 6

## 2023-04-19 MED ORDER — SODIUM CHLORIDE 0.9 % IV SOLN
10.0000 mg | Freq: Once | INTRAVENOUS | Status: AC
  Administered 2023-04-19: 10 mg via INTRAVENOUS
  Filled 2023-04-19: qty 10

## 2023-04-19 NOTE — Patient Instructions (Signed)
[facility]  Discharge Instructions: Thank you for choosing [facility] to provide your oncology and hematology care.   If you have a lab appointment with the Cancer Center, please go directly to the Cancer Center and check in at the registration area.   Wear comfortable clothing and clothing appropriate for easy access to any Portacath or PICC line.   We strive to give you quality time with your provider. You may need to reschedule your appointment if you arrive late (15 or more minutes).  Arriving late affects you and other patients whose appointments are after yours.  Also, if you miss three or more appointments without notifying the office, you may be dismissed from the clinic at the provider's discretion.      For prescription refill requests, have your pharmacy contact our office and allow 72 hours for refills to be completed.    Today you received the following chemotherapy and/or immunotherapy agents: bendamustine      To help prevent nausea and vomiting after your treatment, we encourage you to take your nausea medication as directed.  BELOW ARE SYMPTOMS THAT SHOULD BE REPORTED IMMEDIATELY: *FEVER GREATER THAN 100.4 F (38 C) OR HIGHER *CHILLS OR SWEATING *NAUSEA AND VOMITING THAT IS NOT CONTROLLED WITH YOUR NAUSEA MEDICATION *UNUSUAL SHORTNESS OF BREATH *UNUSUAL BRUISING OR BLEEDING *URINARY PROBLEMS (pain or burning when urinating, or frequent urination) *BOWEL PROBLEMS (unusual diarrhea, constipation, pain near the anus) TENDERNESS IN MOUTH AND THROAT WITH OR WITHOUT PRESENCE OF ULCERS (sore throat, sores in mouth, or a toothache) UNUSUAL RASH, SWELLING OR PAIN  UNUSUAL VAGINAL DISCHARGE OR ITCHING   Items with * indicate a potential emergency and should be followed up as soon as possible or go to the Emergency Department if any problems should occur.  Please show the CHEMOTHERAPY ALERT CARD or IMMUNOTHERAPY ALERT CARD at  check-in to the Emergency Department and triage nurse.  Should you have questions after your visit or need to cancel or reschedule your appointment, please contact [facility]  Dept: [phone]  and follow the prompts.  Office hours are 8:00 a.m. to 4:30 p.m. Monday - Friday. Please note that voicemails left after 4:00 p.m. may not be returned until the following business day.  We are closed weekends and major holidays. You have access to a nurse at all times for urgent questions. Please call the main number to the clinic Dept: [phone] and follow the prompts.   For any non-urgent questions, you may also contact your provider using MyChart. We now offer e-Visits for anyone 18 and older to request care online for non-urgent symptoms. For details visit [email or web address].   Also download the MyChart app! Go to the app store, search "MyChart", open the app, select [facility], and log in with your MyChart username and password.  Bendamustine Injection What is this medication? BENDAMUSTINE (BEN da MUS teen) treats leukemia and lymphoma. It works by slowing down the growth of cancer cells. This medicine may be used for other purposes; ask your health care provider or pharmacist if you have questions. COMMON BRAND NAME(S): BELRAPZO, BENDEKA, Treanda, VIVIMUSTA What should I tell my care team before I take this medication? They need to know if you have any of these conditions: Infection, especially a viral infection, such as chickenpox, cold sores, herpes Kidney disease Liver disease An unusual or allergic reaction to bendamustine, mannitol, other medications, foods, dyes, or preservatives Pregnant or trying to get   pregnant Breast-feeding How should I use this medication? This medication is injected into a vein. It is given by your care team in a hospital or clinic setting. Talk to your care team about the use of this medication in children. Special care  may be needed. Overdosage: If you think you have taken too much of this medicine contact a poison control center or emergency room at once. NOTE: This medicine is only for you. Do not share this medicine with others. What if I miss a dose? Keep appointments for follow-up doses. It is important not to miss your dose. Call your care team if you are unable to keep an appointment. What may interact with this medication? Do not take this medication with any of the following: Clozapine This medication may also interact with the following: Atazanavir Cimetidine Ciprofloxacin Enoxacin Fluvoxamine Medications for seizures, such as carbamazepine, phenobarbital Mexiletine Rifampin Tacrine Thiabendazole Zileuton This list may not describe all possible interactions. Give your health care provider a list of all the medicines, herbs, non-prescription drugs, or dietary supplements you use. Also tell them if you smoke, drink alcohol, or use illegal drugs. Some items may interact with your medicine. What should I watch for while using this medication? Visit your care team for regular checks on your progress. This medication may make you feel generally unwell. This is not uncommon, as chemotherapy can affect healthy cells as well as cancer cells. Report any side effects. Continue your course of treatment even though you feel ill unless your care team tells you to stop. You may need blood work while taking this medication. This medication may increase your risk of getting an infection. Call your care team for advice if you get a fever, chills, sore throat, or other symptoms of a cold or flu. Do not treat yourself. Try to avoid being around people who are sick. This medication may cause serious skin reactions. They can happen weeks to months after starting the medication. Contact your care team right away if you notice fevers or flu-like symptoms with a rash. The rash may be red or purple and then turn into  blisters or peeling of the skin. You may also notice a red rash with swelling of the face, lips, or lymph nodes in your neck or under your arms. In some patients, this medication may cause a serious brain infection that may cause death. If you have any problems seeing, thinking, speaking, walking, or standing, tell your care team right away. If you cannot reach your care team, urgently seek other source of medical care. This medication may increase your risk to bruise or bleed. Call your care team if you notice any unusual bleeding. Talk to your care team about your risk of cancer. You may be more at risk for certain types of cancer if you take this medication. Talk to your care team about your risk of skin cancer. You may be more at risk for skin cancer if you take this medication. Talk to your care team if you or your partner wish to become pregnant or think either of you might be pregnant. This medication can cause serious birth defects if taken during pregnancy or for up to 6 months after the last dose. A negative pregnancy test is required before starting this medication. A reliable form of contraception is recommended while taking this medication and for 6 months after the last dose. Talk to your care team about reliable forms of contraception. Wear a condom while taking this medication   and for at least 3 months after the last dose. Do not breast-feed while taking this medication or for at least 1 week after the last dose. This medication may cause infertility. Talk to your care team if you are concerned about your fertility. What side effects may I notice from receiving this medication? Side effects that you should report to your care team as soon as possible: Allergic reactions--skin rash, itching, hives, swelling of the face, lips, tongue, or throat Infection--fever, chills, cough, sore throat, wounds that don't heal, pain or trouble when passing urine, general feeling of discomfort or being  unwell Infusion reactions--chest pain, shortness of breath or trouble breathing, feeling faint or lightheaded Liver injury--right upper belly pain, loss of appetite, nausea, light-colored stool, dark yellow or brown urine, yellowing skin or eyes, unusual weakness or fatigue Low red blood cell level--unusual weakness or fatigue, dizziness, headache, trouble breathing Painful swelling, warmth, or redness of the skin, blisters or sores at the infusion site Rash, fever, and swollen lymph nodes Redness, blistering, peeling, or loosening of the skin, including inside the mouth Tumor lysis syndrome (TLS)--nausea, vomiting, diarrhea, decrease in the amount of urine, dark urine, unusual weakness or fatigue, confusion, muscle pain or cramps, fast or irregular heartbeat, joint pain Unusual bruising or bleeding Side effects that usually do not require medical attention (report to your care team if they continue or are bothersome): Diarrhea Fatigue Headache Loss of appetite Nausea Vomiting This list may not describe all possible side effects. Call your doctor for medical advice about side effects. You may report side effects to FDA at 1-800-FDA-1088. Where should I keep my medication? This medication is given in a hospital or clinic. It will not be stored at home. NOTE: This sheet is a summary. It may not cover all possible information. If you have questions about this medicine, talk to your doctor, pharmacist, or health care provider.  2024 [employer]/Gold Standard (2022-01-17 00:00:00)   

## 2023-05-03 ENCOUNTER — Inpatient Hospital Stay: Payer: BC Managed Care – PPO

## 2023-05-03 DIAGNOSIS — Z5111 Encounter for antineoplastic chemotherapy: Secondary | ICD-10-CM | POA: Diagnosis not present

## 2023-05-03 DIAGNOSIS — C8299 Follicular lymphoma, unspecified, extranodal and solid organ sites: Secondary | ICD-10-CM

## 2023-05-03 DIAGNOSIS — Z86010 Personal history of colonic polyps: Secondary | ICD-10-CM | POA: Diagnosis not present

## 2023-05-03 DIAGNOSIS — E282 Polycystic ovarian syndrome: Secondary | ICD-10-CM | POA: Diagnosis not present

## 2023-05-03 DIAGNOSIS — Z800 Family history of malignant neoplasm of digestive organs: Secondary | ICD-10-CM | POA: Diagnosis not present

## 2023-05-03 DIAGNOSIS — Z79899 Other long term (current) drug therapy: Secondary | ICD-10-CM | POA: Diagnosis not present

## 2023-05-03 DIAGNOSIS — C8204 Follicular lymphoma grade I, lymph nodes of axilla and upper limb: Secondary | ICD-10-CM | POA: Diagnosis not present

## 2023-05-03 LAB — CBC WITH DIFFERENTIAL (CANCER CENTER ONLY)
Abs Immature Granulocytes: 0.01 10*3/uL (ref 0.00–0.07)
Basophils Absolute: 0 10*3/uL (ref 0.0–0.1)
Basophils Relative: 1 %
Eosinophils Absolute: 0.1 10*3/uL (ref 0.0–0.5)
Eosinophils Relative: 4 %
HCT: 35.5 % — ABNORMAL LOW (ref 36.0–46.0)
Hemoglobin: 12.4 g/dL (ref 12.0–15.0)
Immature Granulocytes: 0 %
Lymphocytes Relative: 6 %
Lymphs Abs: 0.2 10*3/uL — ABNORMAL LOW (ref 0.7–4.0)
MCH: 32.5 pg (ref 26.0–34.0)
MCHC: 34.9 g/dL (ref 30.0–36.0)
MCV: 92.9 fL (ref 80.0–100.0)
Monocytes Absolute: 0.5 10*3/uL (ref 0.1–1.0)
Monocytes Relative: 15 %
Neutro Abs: 2.6 10*3/uL (ref 1.7–7.7)
Neutrophils Relative %: 74 %
Platelet Count: 200 10*3/uL (ref 150–400)
RBC: 3.82 MIL/uL — ABNORMAL LOW (ref 3.87–5.11)
RDW: 12.5 % (ref 11.5–15.5)
WBC Count: 3.5 10*3/uL — ABNORMAL LOW (ref 4.0–10.5)
nRBC: 0 % (ref 0.0–0.2)

## 2023-05-11 ENCOUNTER — Other Ambulatory Visit: Payer: BC Managed Care – PPO

## 2023-05-11 ENCOUNTER — Encounter (HOSPITAL_COMMUNITY): Admission: RE | Admit: 2023-05-11 | Payer: BC Managed Care – PPO | Source: Ambulatory Visit

## 2023-05-11 DIAGNOSIS — C8299 Follicular lymphoma, unspecified, extranodal and solid organ sites: Secondary | ICD-10-CM | POA: Insufficient documentation

## 2023-05-11 DIAGNOSIS — C8590 Non-Hodgkin lymphoma, unspecified, unspecified site: Secondary | ICD-10-CM | POA: Diagnosis not present

## 2023-05-11 DIAGNOSIS — C8598 Non-Hodgkin lymphoma, unspecified, lymph nodes of multiple sites: Secondary | ICD-10-CM | POA: Diagnosis not present

## 2023-05-11 LAB — GLUCOSE, CAPILLARY: Glucose-Capillary: 100 mg/dL — ABNORMAL HIGH (ref 70–99)

## 2023-05-11 MED ORDER — FLUDEOXYGLUCOSE F - 18 (FDG) INJECTION
6.8100 | Freq: Once | INTRAVENOUS | Status: AC
  Administered 2023-05-11: 6.81 via INTRAVENOUS

## 2023-05-13 ENCOUNTER — Other Ambulatory Visit: Payer: Self-pay | Admitting: Oncology

## 2023-05-16 ENCOUNTER — Encounter: Payer: Self-pay | Admitting: Oncology

## 2023-05-16 ENCOUNTER — Encounter: Payer: Self-pay | Admitting: *Deleted

## 2023-05-16 ENCOUNTER — Inpatient Hospital Stay: Payer: BC Managed Care – PPO | Attending: Oncology

## 2023-05-16 ENCOUNTER — Inpatient Hospital Stay: Payer: BC Managed Care – PPO | Admitting: Oncology

## 2023-05-16 ENCOUNTER — Inpatient Hospital Stay: Payer: BC Managed Care – PPO

## 2023-05-16 VITALS — BP 102/60 | HR 67 | Temp 98.7°F | Resp 18

## 2023-05-16 VITALS — BP 108/70 | HR 73 | Temp 98.2°F | Resp 18 | Wt 138.0 lb

## 2023-05-16 DIAGNOSIS — Z5111 Encounter for antineoplastic chemotherapy: Secondary | ICD-10-CM | POA: Insufficient documentation

## 2023-05-16 DIAGNOSIS — Z79899 Other long term (current) drug therapy: Secondary | ICD-10-CM | POA: Diagnosis not present

## 2023-05-16 DIAGNOSIS — C8204 Follicular lymphoma grade I, lymph nodes of axilla and upper limb: Secondary | ICD-10-CM | POA: Diagnosis not present

## 2023-05-16 DIAGNOSIS — C8299 Follicular lymphoma, unspecified, extranodal and solid organ sites: Secondary | ICD-10-CM | POA: Diagnosis not present

## 2023-05-16 LAB — CBC WITH DIFFERENTIAL (CANCER CENTER ONLY)
Abs Immature Granulocytes: 0.01 10*3/uL (ref 0.00–0.07)
Basophils Absolute: 0 10*3/uL (ref 0.0–0.1)
Basophils Relative: 1 %
Eosinophils Absolute: 0.1 10*3/uL (ref 0.0–0.5)
Eosinophils Relative: 3 %
HCT: 37.1 % (ref 36.0–46.0)
Hemoglobin: 12.8 g/dL (ref 12.0–15.0)
Immature Granulocytes: 0 %
Lymphocytes Relative: 10 %
Lymphs Abs: 0.2 10*3/uL — ABNORMAL LOW (ref 0.7–4.0)
MCH: 32.2 pg (ref 26.0–34.0)
MCHC: 34.5 g/dL (ref 30.0–36.0)
MCV: 93.2 fL (ref 80.0–100.0)
Monocytes Absolute: 0.4 10*3/uL (ref 0.1–1.0)
Monocytes Relative: 18 %
Neutro Abs: 1.7 10*3/uL (ref 1.7–7.7)
Neutrophils Relative %: 68 %
Platelet Count: 168 10*3/uL (ref 150–400)
RBC: 3.98 MIL/uL (ref 3.87–5.11)
RDW: 12.6 % (ref 11.5–15.5)
WBC Count: 2.5 10*3/uL — ABNORMAL LOW (ref 4.0–10.5)
nRBC: 0 % (ref 0.0–0.2)

## 2023-05-16 LAB — CMP (CANCER CENTER ONLY)
ALT: 9 U/L (ref 0–44)
AST: 13 U/L — ABNORMAL LOW (ref 15–41)
Albumin: 4.4 g/dL (ref 3.5–5.0)
Alkaline Phosphatase: 61 U/L (ref 38–126)
Anion gap: 7 (ref 5–15)
BUN: 13 mg/dL (ref 6–20)
CO2: 28 mmol/L (ref 22–32)
Calcium: 9.5 mg/dL (ref 8.9–10.3)
Chloride: 105 mmol/L (ref 98–111)
Creatinine: 0.67 mg/dL (ref 0.44–1.00)
GFR, Estimated: >60 mL/min (ref >60)
Glucose, Bld: 122 mg/dL — ABNORMAL HIGH (ref 70–99)
Potassium: 4 mmol/L (ref 3.5–5.1)
Sodium: 140 mmol/L (ref 135–145)
Total Bilirubin: 0.3 mg/dL (ref 0.3–1.2)
Total Protein: 6.8 g/dL (ref 6.5–8.1)

## 2023-05-16 LAB — PREGNANCY, URINE: Preg Test, Ur: NEGATIVE

## 2023-05-16 MED ORDER — SODIUM CHLORIDE 0.9 % IV SOLN: Freq: Once | INTRAVENOUS | Status: AC

## 2023-05-16 MED ORDER — SODIUM CHLORIDE 0.9 % IV SOLN
375.0000 mg/m2 | Freq: Once | INTRAVENOUS | Status: AC
  Administered 2023-05-16: 600 mg via INTRAVENOUS
  Filled 2023-05-16: qty 50

## 2023-05-16 MED ORDER — SODIUM CHLORIDE 0.9 % IV SOLN
10.0000 mg | Freq: Once | INTRAVENOUS | Status: AC
  Administered 2023-05-16: 10 mg via INTRAVENOUS
  Filled 2023-05-16: qty 10

## 2023-05-16 MED ORDER — PALONOSETRON HCL INJECTION 0.25 MG/5ML
0.2500 mg | Freq: Once | INTRAVENOUS | Status: AC
  Administered 2023-05-16: 0.25 mg via INTRAVENOUS
  Filled 2023-05-16: qty 5

## 2023-05-16 MED ORDER — SODIUM CHLORIDE 0.9 % IV SOLN
90.0000 mg/m2 | Freq: Once | INTRAVENOUS | Status: AC
  Administered 2023-05-16: 150 mg via INTRAVENOUS
  Filled 2023-05-16: qty 6

## 2023-05-16 MED ORDER — ACETAMINOPHEN 325 MG PO TABS
650.0000 mg | ORAL_TABLET | Freq: Once | ORAL | Status: AC
  Administered 2023-05-16: 650 mg via ORAL
  Filled 2023-05-16: qty 2

## 2023-05-16 MED ORDER — FAMOTIDINE IN NACL 20-0.9 MG/50ML-% IV SOLN
20.0000 mg | Freq: Once | INTRAVENOUS | Status: AC
  Administered 2023-05-16: 20 mg via INTRAVENOUS
  Filled 2023-05-16: qty 50

## 2023-05-16 MED ORDER — DIPHENHYDRAMINE HCL 25 MG PO CAPS
50.0000 mg | ORAL_CAPSULE | Freq: Once | ORAL | Status: AC
  Administered 2023-05-16: 50 mg via ORAL
  Filled 2023-05-16: qty 2

## 2023-05-16 NOTE — Patient Instructions (Signed)
[facility]   Discharge Instructions: Thank you for choosing [facility] to provide your oncology and hematology care.   If you have a lab appointment with the Cancer Center, please go directly to the Cancer Center and check in at the registration area.   Wear comfortable clothing and clothing appropriate for easy access to any Portacath or PICC line.   We strive to give you quality time with your provider. You may need to reschedule your appointment if you arrive late (15 or more minutes).  Arriving late affects you and other patients whose appointments are after yours.  Also, if you miss three or more appointments without notifying the office, you may be dismissed from the clinic at the provider's discretion.      For prescription refill requests, have your pharmacy contact our office and allow 72 hours for refills to be completed.    Today you received the following chemotherapy and/or immunotherapy agents Rituxan/Bendeka      To help prevent nausea and vomiting after your treatment, we encourage you to take your nausea medication as directed.  BELOW ARE SYMPTOMS THAT SHOULD BE REPORTED IMMEDIATELY: *FEVER GREATER THAN 100.4 F (38 C) OR HIGHER *CHILLS OR SWEATING *NAUSEA AND VOMITING THAT IS NOT CONTROLLED WITH YOUR NAUSEA MEDICATION *UNUSUAL SHORTNESS OF BREATH *UNUSUAL BRUISING OR BLEEDING *URINARY PROBLEMS (pain or burning when urinating, or frequent urination) *BOWEL PROBLEMS (unusual diarrhea, constipation, pain near the anus) TENDERNESS IN MOUTH AND THROAT WITH OR WITHOUT PRESENCE OF ULCERS (sore throat, sores in mouth, or a toothache) UNUSUAL RASH, SWELLING OR PAIN  UNUSUAL VAGINAL DISCHARGE OR ITCHING   Items with * indicate a potential emergency and should be followed up as soon as possible or go to the Emergency Department if any problems should occur.  Please show the CHEMOTHERAPY ALERT CARD or IMMUNOTHERAPY ALERT CARD at  check-in to the Emergency Department and triage nurse.  Should you have questions after your visit or need to cancel or reschedule your appointment, please contact [facility]  Dept: [phone]  and follow the prompts.  Office hours are 8:00 a.m. to 4:30 p.m. Monday - Friday. Please note that voicemails left after 4:00 p.m. may not be returned until the following business day.  We are closed weekends and major holidays. You have access to a nurse at all times for urgent questions. Please call the main number to the clinic Dept: [phone] and follow the prompts.   For any non-urgent questions, you may also contact your provider using MyChart. We now offer e-Visits for anyone 26 and older to request care online for non-urgent symptoms. For details visit [email or web address].   Also download the MyChart app! Go to the app store, search "MyChart", open the app, select [place], and log in with your MyChart username and password.  Rituximab Injection What is this medication? RITUXIMAB (ri TUX i mab) treats leukemia and lymphoma. It works by blocking a protein that causes cancer cells to grow and multiply. This helps to slow or stop the spread of cancer cells. It may also be used to treat autoimmune conditions, such as arthritis. It works by slowing down an overactive immune system. It is a monoclonal antibody. This medicine may be used for other purposes; ask your health care provider or pharmacist if you have questions. COMMON BRAND NAME(S): RIABNI, Rituxan, RUXIENCE, truxima What should I tell my care team before I take this medication? They need to  know if you have any of these conditions: Chest pain Heart disease Immune system problems Infection, such as chickenpox, cold sores, hepatitis B, herpes Irregular heartbeat or rhythm Kidney disease Low blood counts, such as low white cells, platelets, red cells Lung disease Recent or upcoming vaccine An  unusual or allergic reaction to rituximab, other medications, foods, dyes, or preservatives Pregnant or trying to get pregnant Breast-feeding How should I use this medication? This medication is injected into a vein. It is given by a care team in a hospital or clinic setting. A special MedGuide will be given to you before each treatment. Be sure to read this information carefully each time. Talk to your care team about the use of this medication in children. While this medication may be prescribed for children as young as 6 months for selected conditions, precautions do apply. Overdosage: If you think you have taken too much of this medicine contact a poison control center or emergency room at once. NOTE: This medicine is only for you. Do not share this medicine with others. What if I miss a dose? Keep appointments for follow-up doses. It is important not to miss your dose. Call your care team if you are unable to keep an appointment. What may interact with this medication? Do not take this medication with any of the following: Live vaccines This medication may also interact with the following: Cisplatin This list may not describe all possible interactions. Give your health care provider a list of all the medicines, herbs, non-prescription drugs, or dietary supplements you use. Also tell them if you smoke, drink alcohol, or use illegal drugs. Some items may interact with your medicine. What should I watch for while using this medication? Your condition will be monitored carefully while you are receiving this medication. You may need blood work while taking this medication. This medication can cause serious infusion reactions. To reduce the risk your care team may give you other medications to take before receiving this one. Be sure to follow the directions from your care team. This medication may increase your risk of getting an infection. Call your care team for advice if you get a fever,  chills, sore throat, or other symptoms of a cold or flu. Do not treat yourself. Try to avoid being around people who are sick. Call your care team if you are around anyone with measles, chickenpox, or if you develop sores or blisters that do not heal properly. Avoid taking medications that contain aspirin, acetaminophen, ibuprofen, naproxen, or ketoprofen unless instructed by your care team. These medications may hide a fever. This medication may cause serious skin reactions. They can happen weeks to months after starting the medication. Contact your care team right away if you notice fevers or flu-like symptoms with a rash. The rash may be red or purple and then turn into blisters or peeling of the skin. You may also notice a red rash with swelling of the face, lips, or lymph nodes in your neck or under your arms. In some patients, this medication may cause a serious brain infection that may cause death. If you have any problems seeing, thinking, speaking, walking, or standing, tell your care team right away. If you cannot reach your care team, urgently seek another source of medical care. Talk to your care team if you may be pregnant. Serious birth defects can occur if you take this medication during pregnancy and for 12 months after the last dose. You will need a negative pregnancy test  before starting this medication. Contraception is recommended while taking this medication and for 12 months after the last dose. Your care team can help you find the option that works for you. Do not breastfeed while taking this medication and for at least 6 months after the last dose. What side effects may I notice from receiving this medication? Side effects that you should report to your care team as soon as possible: Allergic reactions or angioedema--skin rash, itching or hives, swelling of the face, eyes, lips, tongue, arms, or legs, trouble swallowing or breathing Bowel blockage--stomach cramping, unable to have a  bowel movement or pass gas, loss of appetite, vomiting Dizziness, loss of balance or coordination, confusion or trouble speaking Heart attack--pain or tightness in the chest, shoulders, arms, or jaw, nausea, shortness of breath, cold or clammy skin, feeling faint or lightheaded Heart rhythm changes--fast or irregular heartbeat, dizziness, feeling faint or lightheaded, chest pain, trouble breathing Infection--fever, chills, cough, sore throat, wounds that don't heal, pain or trouble when passing urine, general feeling of discomfort or being unwell Infusion reactions--chest pain, shortness of breath or trouble breathing, feeling faint or lightheaded Kidney injury--decrease in the amount of urine, swelling of the ankles, hands, or feet Liver injury--right upper belly pain, loss of appetite, nausea, light-colored stool, dark yellow or brown urine, yellowing skin or eyes, unusual weakness or fatigue Redness, blistering, peeling, or loosening of the skin, including inside the mouth Stomach pain that is severe, does not go away, or gets worse Tumor lysis syndrome (TLS)--nausea, vomiting, diarrhea, decrease in the amount of urine, dark urine, unusual weakness or fatigue, confusion, muscle pain or cramps, fast or irregular heartbeat, joint pain Side effects that usually do not require medical attention (report to your care team if they continue or are bothersome): Headache Joint pain Nausea Runny or stuffy nose Unusual weakness or fatigue This list may not describe all possible side effects. Call your doctor for medical advice about side effects. You may report side effects to FDA at 1-800-FDA-1088. Where should I keep my medication? This medication is given in a hospital or clinic. It will not be stored at home. NOTE: This sheet is a summary. It may not cover all possible information. If you have questions about this medicine, talk to your doctor, pharmacist, or health care provider.  2024  Elsevier/Gold Standard (2022-02-16 00:00:00)  Bendamustine Injection What is this medication? BENDAMUSTINE (BEN da MUS teen) treats leukemia and lymphoma. It works by slowing down the growth of cancer cells. This medicine may be used for other purposes; ask your health care provider or pharmacist if you have questions. COMMON BRAND NAME(S): Marilynne Halsted, VIVIMUSTA What should I tell my care team before I take this medication? They need to know if you have any of these conditions: Infection, especially a viral infection, such as chickenpox, cold sores, herpes Kidney disease Liver disease An unusual or allergic reaction to bendamustine, mannitol, other medications, foods, dyes, or preservatives Pregnant or trying to get pregnant Breast-feeding How should I use this medication? This medication is injected into a vein. It is given by your care team in a hospital or clinic setting. Talk to your care team about the use of this medication in children. Special care may be needed. Overdosage: If you think you have taken too much of this medicine contact a poison control center or emergency room at once. NOTE: This medicine is only for you. Do not share this medicine with others. What if I miss a dose?  Keep appointments for follow-up doses. It is important not to miss your dose. Call your care team if you are unable to keep an appointment. What may interact with this medication? Do not take this medication with any of the following: Clozapine This medication may also interact with the following: Atazanavir Cimetidine Ciprofloxacin Enoxacin Fluvoxamine Medications for seizures, such as carbamazepine, phenobarbital Mexiletine Rifampin Tacrine Thiabendazole Zileuton This list may not describe all possible interactions. Give your health care provider a list of all the medicines, herbs, non-prescription drugs, or dietary supplements you use. Also tell them if you smoke, drink  alcohol, or use illegal drugs. Some items may interact with your medicine. What should I watch for while using this medication? Visit your care team for regular checks on your progress. This medication may make you feel generally unwell. This is not uncommon, as chemotherapy can affect healthy cells as well as cancer cells. Report any side effects. Continue your course of treatment even though you feel ill unless your care team tells you to stop. You may need blood work while taking this medication. This medication may increase your risk of getting an infection. Call your care team for advice if you get a fever, chills, sore throat, or other symptoms of a cold or flu. Do not treat yourself. Try to avoid being around people who are sick. This medication may cause serious skin reactions. They can happen weeks to months after starting the medication. Contact your care team right away if you notice fevers or flu-like symptoms with a rash. The rash may be red or purple and then turn into blisters or peeling of the skin. You may also notice a red rash with swelling of the face, lips, or lymph nodes in your neck or under your arms. In some patients, this medication may cause a serious brain infection that may cause death. If you have any problems seeing, thinking, speaking, walking, or standing, tell your care team right away. If you cannot reach your care team, urgently seek other source of medical care. This medication may increase your risk to bruise or bleed. Call your care team if you notice any unusual bleeding. Talk to your care team about your risk of cancer. You may be more at risk for certain types of cancer if you take this medication. Talk to your care team about your risk of skin cancer. You may be more at risk for skin cancer if you take this medication. Talk to your care team if you or your partner wish to become pregnant or think either of you might be pregnant. This medication can cause serious  birth defects if taken during pregnancy or for up to 6 months after the last dose. A negative pregnancy test is required before starting this medication. A reliable form of contraception is recommended while taking this medication and for 6 months after the last dose. Talk to your care team about reliable forms of contraception. Wear a condom while taking this medication and for at least 3 months after the last dose. Do not breast-feed while taking this medication or for at least 1 week after the last dose. This medication may cause infertility. Talk to your care team if you are concerned about your fertility. What side effects may I notice from receiving this medication? Side effects that you should report to your care team as soon as possible: Allergic reactions--skin rash, itching, hives, swelling of the face, lips, tongue, or throat Infection--fever, chills, cough, sore throat, wounds that  don't heal, pain or trouble when passing urine, general feeling of discomfort or being unwell Infusion reactions--chest pain, shortness of breath or trouble breathing, feeling faint or lightheaded Liver injury--right upper belly pain, loss of appetite, nausea, light-colored stool, dark yellow or brown urine, yellowing skin or eyes, unusual weakness or fatigue Low red blood cell level--unusual weakness or fatigue, dizziness, headache, trouble breathing Painful swelling, warmth, or redness of the skin, blisters or sores at the infusion site Rash, fever, and swollen lymph nodes Redness, blistering, peeling, or loosening of the skin, including inside the mouth Tumor lysis syndrome (TLS)--nausea, vomiting, diarrhea, decrease in the amount of urine, dark urine, unusual weakness or fatigue, confusion, muscle pain or cramps, fast or irregular heartbeat, joint pain Unusual bruising or bleeding Side effects that usually do not require medical attention (report to your care team if they continue or are  bothersome): Diarrhea Fatigue Headache Loss of appetite Nausea Vomiting This list may not describe all possible side effects. Call your doctor for medical advice about side effects. You may report side effects to FDA at 1-800-FDA-1088. Where should I keep my medication? This medication is given in a hospital or clinic. It will not be stored at home. NOTE: This sheet is a summary. It may not cover all possible information. If you have questions about this medicine, talk to your doctor, pharmacist, or health care provider.  2024 Elsevier/Gold Standard (2022-01-17 00:00:00)

## 2023-05-16 NOTE — Progress Notes (Signed)
  [facility] OFFICE PROGRESS NOTE   Diagnosis: Non-Hodgkin's lymphoma  INTERVAL HISTORY:   Ms. Restivo completed cycle 4 Bendamustine/rituximab beginning 04/18/2023.  No rash or fever following this cycle of chemotherapy.  She reports 1 mouth ulcer.  This has resolved.  She had mild nausea following chemotherapy.  No emesis.  No night sweats.  Objective:  Vital signs in last 24 hours:  Blood pressure 108/70, pulse 73, temperature 98.2 F (36.8 C), temperature source Oral, resp. rate 18, weight 138 lb (62.6 kg), SpO2 100%.    HEENT: No thrush or ulcers Lymphatics: No cervical, supraclavicular, axillary, or inguinal nodes Resp: Lungs clear bilaterally Cardio: Regular rate and rhythm GI: No hepatosplenomegaly Vascular: No leg edema  Lab Results:  Lab Results  Component Value Date   WBC 2.5 (L) 05/16/2023   HGB 12.8 05/16/2023   HCT 37.1 05/16/2023   MCV 93.2 05/16/2023   PLT 168 05/16/2023   NEUTROABS 1.7 05/16/2023    CMP  Lab Results  Component Value Date   NA 140 05/16/2023   K 4.0 05/16/2023   CL 105 05/16/2023   CO2 28 05/16/2023   GLUCOSE 122 (H) 05/16/2023   BUN 13 05/16/2023   CREATININE 0.67 05/16/2023   CALCIUM 9.5 05/16/2023   PROT 6.8 05/16/2023   ALBUMIN 4.4 05/16/2023   AST 13 (L) 05/16/2023   ALT 9 05/16/2023   ALKPHOS 61 05/16/2023   BILITOT 0.3 05/16/2023   GFRNONAA >60 05/16/2023   GFRAA >90 06/15/2013    Medications: I have reviewed the patient's current medications.   Assessment/Plan: Follicular lymphoma-low-grade, clinical stage III, FLIPI-intermediate risk Bilateral axillary ultrasound 08/29/2023-mildly prominent lymph nodes, right axillary node without a defined preserved fatty hilum Ultrasound-guided biopsy of right axillary lymph node 12/19/2022-follicular lymphoma, overall grade 1 of 3, focal grade 2, Ki-67 less than 10% with areas of under 5% and areas at 15-20%, CD20, BCL6, CD10 positive.  Bcl-2 positive.  Flow  cytometry-lambda restricted CD10 positive B-cell lymphoma PET 01/04/2023-mild to moderate FDG activity associated with borderline enlarged bilateral axillary, retroperitoneal, mesenteric, right inguinal, and right external iliac nodes, mild diffuse increased uptake in the spleen Cycle 1 Bendamustine/rituximab 01/24/2023 Cycle 2 Bendamustine/rituximab 02/21/2023, day 2 Bendamustine held secondary to fever, arthralgias, and rash Cycle 3 Bendamustine/rituximab 03/20/2023 Cycle 4 Bendamustine/rituximab 04/18/2023 PET 05/11/2023-resolution of hypermetabolic lymph nodes in the neck, chest, abdomen, and pelvis, [place] 1, resolution of splenic hypermetabolism, no evidence of residual metabolically active lymphoma. Cycle 5 Bendamustine/rituximab 05/16/2023 PCOS multiple miscarriages History of a colon polyp-tubular adenoma Family history of colon and esophagus cancer     Disposition: Ms. Harmelink appears well.  She has completed 4 cycles of Bendamustine/rituximab.  The restaging CT is consistent with a complete clinical response.  Sweats have resolved.  The plan is to complete 2 more cycles of Bendamustine/Rituxan beginning with cycle 5 today.  She will return for an office visit and cycle 6 Bendamustine/rituximab in 4 weeks.  I reviewed the PET findings and images with her.  Thornton Papas, MD  05/16/2023  1:20 PM

## 2023-05-16 NOTE — Progress Notes (Signed)
Patient seen by Dr. Sherrill today ? ?Vitals are within treatment parameters. ? ?Labs reviewed by Dr. Sherrill and are within treatment parameters. ? ?Per physician team, patient is ready for treatment and there are NO modifications to the treatment plan.  ?

## 2023-05-17 ENCOUNTER — Inpatient Hospital Stay: Payer: BC Managed Care – PPO

## 2023-05-17 VITALS — BP 107/71 | HR 73 | Temp 98.4°F | Resp 18

## 2023-05-17 DIAGNOSIS — Z79899 Other long term (current) drug therapy: Secondary | ICD-10-CM | POA: Diagnosis not present

## 2023-05-17 DIAGNOSIS — C8299 Follicular lymphoma, unspecified, extranodal and solid organ sites: Secondary | ICD-10-CM

## 2023-05-17 DIAGNOSIS — Z5111 Encounter for antineoplastic chemotherapy: Secondary | ICD-10-CM | POA: Diagnosis not present

## 2023-05-17 DIAGNOSIS — C8204 Follicular lymphoma grade I, lymph nodes of axilla and upper limb: Secondary | ICD-10-CM | POA: Diagnosis not present

## 2023-05-17 MED ORDER — SODIUM CHLORIDE 0.9 % IV SOLN
10.0000 mg | Freq: Once | INTRAVENOUS | Status: AC
  Administered 2023-05-17: 10 mg via INTRAVENOUS
  Filled 2023-05-17: qty 10

## 2023-05-17 MED ORDER — FAMOTIDINE IN NACL 20-0.9 MG/50ML-% IV SOLN
20.0000 mg | Freq: Once | INTRAVENOUS | Status: AC
  Administered 2023-05-17: 20 mg via INTRAVENOUS
  Filled 2023-05-17: qty 50

## 2023-05-17 MED ORDER — SODIUM CHLORIDE 0.9 % IV SOLN
90.0000 mg/m2 | Freq: Once | INTRAVENOUS | Status: AC
  Administered 2023-05-17: 150 mg via INTRAVENOUS
  Filled 2023-05-17: qty 6

## 2023-05-17 MED ORDER — SODIUM CHLORIDE 0.9 % IV SOLN: Freq: Once | INTRAVENOUS | Status: AC

## 2023-05-17 NOTE — Patient Instructions (Signed)
[facility]  Discharge Instructions: Thank you for choosing [facility] to provide your oncology and hematology care.   If you have a lab appointment with the Cancer Center, please go directly to the Cancer Center and check in at the registration area.   Wear comfortable clothing and clothing appropriate for easy access to any Portacath or PICC line.   We strive to give you quality time with your provider. You may need to reschedule your appointment if you arrive late (15 or more minutes).  Arriving late affects you and other patients whose appointments are after yours.  Also, if you miss three or more appointments without notifying the office, you may be dismissed from the clinic at the provider's discretion.      For prescription refill requests, have your pharmacy contact our office and allow 72 hours for refills to be completed.    Today you received the following chemotherapy and/or immunotherapy agents: bendamustine      To help prevent nausea and vomiting after your treatment, we encourage you to take your nausea medication as directed.  BELOW ARE SYMPTOMS THAT SHOULD BE REPORTED IMMEDIATELY: *FEVER GREATER THAN 100.4 F (38 C) OR HIGHER *CHILLS OR SWEATING *NAUSEA AND VOMITING THAT IS NOT CONTROLLED WITH YOUR NAUSEA MEDICATION *UNUSUAL SHORTNESS OF BREATH *UNUSUAL BRUISING OR BLEEDING *URINARY PROBLEMS (pain or burning when urinating, or frequent urination) *BOWEL PROBLEMS (unusual diarrhea, constipation, pain near the anus) TENDERNESS IN MOUTH AND THROAT WITH OR WITHOUT PRESENCE OF ULCERS (sore throat, sores in mouth, or a toothache) UNUSUAL RASH, SWELLING OR PAIN  UNUSUAL VAGINAL DISCHARGE OR ITCHING   Items with * indicate a potential emergency and should be followed up as soon as possible or go to the Emergency Department if any problems should occur.  Please show the CHEMOTHERAPY ALERT CARD or IMMUNOTHERAPY ALERT CARD at  check-in to the Emergency Department and triage nurse.  Should you have questions after your visit or need to cancel or reschedule your appointment, please contact [facility]  Dept: [phone]  and follow the prompts.  Office hours are 8:00 a.m. to 4:30 p.m. Monday - Friday. Please note that voicemails left after 4:00 p.m. may not be returned until the following business day.  We are closed weekends and major holidays. You have access to a nurse at all times for urgent questions. Please call the main number to the clinic Dept: [phone] and follow the prompts.   For any non-urgent questions, you may also contact your provider using MyChart. We now offer e-Visits for anyone 18 and older to request care online for non-urgent symptoms. For details visit [email or web address].   Also download the MyChart app! Go to the app store, search "MyChart", open the app, select [facility], and log in with your MyChart username and password.  Bendamustine Injection What is this medication? BENDAMUSTINE (BEN da MUS teen) treats leukemia and lymphoma. It works by slowing down the growth of cancer cells. This medicine may be used for other purposes; ask your health care provider or pharmacist if you have questions. COMMON BRAND NAME(S): BELRAPZO, BENDEKA, Treanda, VIVIMUSTA What should I tell my care team before I take this medication? They need to know if you have any of these conditions: Infection, especially a viral infection, such as chickenpox, cold sores, herpes Kidney disease Liver disease An unusual or allergic reaction to bendamustine, mannitol, other medications, foods, dyes, or preservatives Pregnant or trying to get   pregnant Breast-feeding How should I use this medication? This medication is injected into a vein. It is given by your care team in a hospital or clinic setting. Talk to your care team about the use of this medication in children. Special care  may be needed. Overdosage: If you think you have taken too much of this medicine contact a poison control center or emergency room at once. NOTE: This medicine is only for you. Do not share this medicine with others. What if I miss a dose? Keep appointments for follow-up doses. It is important not to miss your dose. Call your care team if you are unable to keep an appointment. What may interact with this medication? Do not take this medication with any of the following: Clozapine This medication may also interact with the following: Atazanavir Cimetidine Ciprofloxacin Enoxacin Fluvoxamine Medications for seizures, such as carbamazepine, phenobarbital Mexiletine Rifampin Tacrine Thiabendazole Zileuton This list may not describe all possible interactions. Give your health care provider a list of all the medicines, herbs, non-prescription drugs, or dietary supplements you use. Also tell them if you smoke, drink alcohol, or use illegal drugs. Some items may interact with your medicine. What should I watch for while using this medication? Visit your care team for regular checks on your progress. This medication may make you feel generally unwell. This is not uncommon, as chemotherapy can affect healthy cells as well as cancer cells. Report any side effects. Continue your course of treatment even though you feel ill unless your care team tells you to stop. You may need blood work while taking this medication. This medication may increase your risk of getting an infection. Call your care team for advice if you get a fever, chills, sore throat, or other symptoms of a cold or flu. Do not treat yourself. Try to avoid being around people who are sick. This medication may cause serious skin reactions. They can happen weeks to months after starting the medication. Contact your care team right away if you notice fevers or flu-like symptoms with a rash. The rash may be red or purple and then turn into  blisters or peeling of the skin. You may also notice a red rash with swelling of the face, lips, or lymph nodes in your neck or under your arms. In some patients, this medication may cause a serious brain infection that may cause death. If you have any problems seeing, thinking, speaking, walking, or standing, tell your care team right away. If you cannot reach your care team, urgently seek other source of medical care. This medication may increase your risk to bruise or bleed. Call your care team if you notice any unusual bleeding. Talk to your care team about your risk of cancer. You may be more at risk for certain types of cancer if you take this medication. Talk to your care team about your risk of skin cancer. You may be more at risk for skin cancer if you take this medication. Talk to your care team if you or your partner wish to become pregnant or think either of you might be pregnant. This medication can cause serious birth defects if taken during pregnancy or for up to 6 months after the last dose. A negative pregnancy test is required before starting this medication. A reliable form of contraception is recommended while taking this medication and for 6 months after the last dose. Talk to your care team about reliable forms of contraception. Wear a condom while taking this medication   and for at least 3 months after the last dose. Do not breast-feed while taking this medication or for at least 1 week after the last dose. This medication may cause infertility. Talk to your care team if you are concerned about your fertility. What side effects may I notice from receiving this medication? Side effects that you should report to your care team as soon as possible: Allergic reactions--skin rash, itching, hives, swelling of the face, lips, tongue, or throat Infection--fever, chills, cough, sore throat, wounds that don't heal, pain or trouble when passing urine, general feeling of discomfort or being  unwell Infusion reactions--chest pain, shortness of breath or trouble breathing, feeling faint or lightheaded Liver injury--right upper belly pain, loss of appetite, nausea, light-colored stool, dark yellow or brown urine, yellowing skin or eyes, unusual weakness or fatigue Low red blood cell level--unusual weakness or fatigue, dizziness, headache, trouble breathing Painful swelling, warmth, or redness of the skin, blisters or sores at the infusion site Rash, fever, and swollen lymph nodes Redness, blistering, peeling, or loosening of the skin, including inside the mouth Tumor lysis syndrome (TLS)--nausea, vomiting, diarrhea, decrease in the amount of urine, dark urine, unusual weakness or fatigue, confusion, muscle pain or cramps, fast or irregular heartbeat, joint pain Unusual bruising or bleeding Side effects that usually do not require medical attention (report to your care team if they continue or are bothersome): Diarrhea Fatigue Headache Loss of appetite Nausea Vomiting This list may not describe all possible side effects. Call your doctor for medical advice about side effects. You may report side effects to FDA at 1-800-FDA-1088. Where should I keep my medication? This medication is given in a hospital or clinic. It will not be stored at home. NOTE: This sheet is a summary. It may not cover all possible information. If you have questions about this medicine, talk to your doctor, pharmacist, or health care provider.  2024 [employer]/Gold Standard (2022-01-17 00:00:00)   

## 2023-05-21 ENCOUNTER — Encounter: Payer: Self-pay | Admitting: Oncology

## 2023-06-10 ENCOUNTER — Other Ambulatory Visit: Payer: Self-pay | Admitting: Oncology

## 2023-06-13 ENCOUNTER — Inpatient Hospital Stay: Payer: BC Managed Care – PPO | Admitting: Oncology

## 2023-06-13 ENCOUNTER — Encounter: Payer: Self-pay | Admitting: *Deleted

## 2023-06-13 ENCOUNTER — Inpatient Hospital Stay: Payer: BC Managed Care – PPO

## 2023-06-13 ENCOUNTER — Inpatient Hospital Stay: Payer: BC Managed Care – PPO | Attending: Oncology

## 2023-06-13 VITALS — BP 108/68 | HR 83 | Temp 97.9°F | Resp 18 | Ht 63.0 in | Wt 138.0 lb

## 2023-06-13 VITALS — BP 98/63 | HR 80 | Temp 98.2°F | Resp 18

## 2023-06-13 DIAGNOSIS — Z5111 Encounter for antineoplastic chemotherapy: Secondary | ICD-10-CM | POA: Insufficient documentation

## 2023-06-13 DIAGNOSIS — C8299 Follicular lymphoma, unspecified, extranodal and solid organ sites: Secondary | ICD-10-CM

## 2023-06-13 DIAGNOSIS — E282 Polycystic ovarian syndrome: Secondary | ICD-10-CM | POA: Diagnosis not present

## 2023-06-13 DIAGNOSIS — C8204 Follicular lymphoma grade I, lymph nodes of axilla and upper limb: Secondary | ICD-10-CM | POA: Diagnosis not present

## 2023-06-13 DIAGNOSIS — Z79899 Other long term (current) drug therapy: Secondary | ICD-10-CM | POA: Insufficient documentation

## 2023-06-13 LAB — CMP (CANCER CENTER ONLY)
ALT: 10 U/L (ref 0–44)
AST: 15 U/L (ref 15–41)
Albumin: 4.3 g/dL (ref 3.5–5.0)
Alkaline Phosphatase: 63 U/L (ref 38–126)
Anion gap: 6 (ref 5–15)
BUN: 12 mg/dL (ref 6–20)
CO2: 30 mmol/L (ref 22–32)
Calcium: 9.3 mg/dL (ref 8.9–10.3)
Chloride: 104 mmol/L (ref 98–111)
Creatinine: 0.69 mg/dL (ref 0.44–1.00)
GFR, Estimated: >60 mL/min (ref >60)
Glucose, Bld: 91 mg/dL (ref 70–99)
Potassium: 3.7 mmol/L (ref 3.5–5.1)
Sodium: 140 mmol/L (ref 135–145)
Total Bilirubin: 0.4 mg/dL (ref 0.3–1.2)
Total Protein: 7 g/dL (ref 6.5–8.1)

## 2023-06-13 LAB — PREGNANCY, URINE: Preg Test, Ur: NEGATIVE

## 2023-06-13 LAB — CBC WITH DIFFERENTIAL (CANCER CENTER ONLY)
Abs Immature Granulocytes: 0.01 10*3/uL (ref 0.00–0.07)
Basophils Absolute: 0 10*3/uL (ref 0.0–0.1)
Basophils Relative: 1 %
Eosinophils Absolute: 0.1 10*3/uL (ref 0.0–0.5)
Eosinophils Relative: 4 %
HCT: 37.4 % (ref 36.0–46.0)
Hemoglobin: 12.7 g/dL (ref 12.0–15.0)
Immature Granulocytes: 0 %
Lymphocytes Relative: 10 %
Lymphs Abs: 0.3 10*3/uL — ABNORMAL LOW (ref 0.7–4.0)
MCH: 31.9 pg (ref 26.0–34.0)
MCHC: 34 g/dL (ref 30.0–36.0)
MCV: 94 fL (ref 80.0–100.0)
Monocytes Absolute: 0.5 10*3/uL (ref 0.1–1.0)
Monocytes Relative: 16 %
Neutro Abs: 2.2 10*3/uL (ref 1.7–7.7)
Neutrophils Relative %: 69 %
Platelet Count: 167 10*3/uL (ref 150–400)
RBC: 3.98 MIL/uL (ref 3.87–5.11)
RDW: 12.5 % (ref 11.5–15.5)
WBC Count: 3.2 10*3/uL — ABNORMAL LOW (ref 4.0–10.5)
nRBC: 0 % (ref 0.0–0.2)

## 2023-06-13 MED ORDER — PALONOSETRON HCL INJECTION 0.25 MG/5ML
0.2500 mg | Freq: Once | INTRAVENOUS | Status: AC
  Administered 2023-06-13: 0.25 mg via INTRAVENOUS
  Filled 2023-06-13: qty 5

## 2023-06-13 MED ORDER — FAMOTIDINE IN NACL 20-0.9 MG/50ML-% IV SOLN
20.0000 mg | Freq: Once | INTRAVENOUS | Status: AC
  Administered 2023-06-13: 20 mg via INTRAVENOUS
  Filled 2023-06-13: qty 50

## 2023-06-13 MED ORDER — DIPHENHYDRAMINE HCL 25 MG PO CAPS
50.0000 mg | ORAL_CAPSULE | Freq: Once | ORAL | Status: AC
  Administered 2023-06-13: 50 mg via ORAL
  Filled 2023-06-13: qty 2

## 2023-06-13 MED ORDER — SODIUM CHLORIDE 0.9 % IV SOLN
90.0000 mg/m2 | Freq: Once | INTRAVENOUS | Status: AC
  Administered 2023-06-13: 150 mg via INTRAVENOUS
  Filled 2023-06-13: qty 6

## 2023-06-13 MED ORDER — SODIUM CHLORIDE 0.9 % IV SOLN
375.0000 mg/m2 | Freq: Once | INTRAVENOUS | Status: AC
  Administered 2023-06-13: 600 mg via INTRAVENOUS
  Filled 2023-06-13: qty 50

## 2023-06-13 MED ORDER — SODIUM CHLORIDE 0.9 % IV SOLN
10.0000 mg | Freq: Once | INTRAVENOUS | Status: AC
  Administered 2023-06-13: 10 mg via INTRAVENOUS
  Filled 2023-06-13: qty 10

## 2023-06-13 MED ORDER — ACETAMINOPHEN 325 MG PO TABS
650.0000 mg | ORAL_TABLET | Freq: Once | ORAL | Status: AC
  Administered 2023-06-13: 650 mg via ORAL
  Filled 2023-06-13: qty 2

## 2023-06-13 MED ORDER — SODIUM CHLORIDE 0.9 % IV SOLN: Freq: Once | INTRAVENOUS | Status: AC

## 2023-06-13 NOTE — Patient Instructions (Signed)
 [facility]   Discharge Instructions: Thank you for choosing [facility] to provide your oncology and hematology care.   If you have a lab appointment with the Cancer Center, please go directly to the Cancer Center and check in at the registration area.   Wear comfortable clothing and clothing appropriate for easy access to any Portacath or PICC line.   We strive to give you quality time with your provider. You may need to reschedule your appointment if you arrive late (15 or more minutes).  Arriving late affects you and other patients whose appointments are after yours.  Also, if you miss three or more appointments without notifying the office, you may be dismissed from the clinic at the provider's discretion.      For prescription refill requests, have your pharmacy contact our office and allow 72 hours for refills to be completed.    Today you received the following chemotherapy and/or immunotherapy agents Rituxan/Bendeka      To help prevent nausea and vomiting after your treatment, we encourage you to take your nausea medication as directed.  BELOW ARE SYMPTOMS THAT SHOULD BE REPORTED IMMEDIATELY: *FEVER GREATER THAN 100.4 F (38 C) OR HIGHER *CHILLS OR SWEATING *NAUSEA AND VOMITING THAT IS NOT CONTROLLED WITH YOUR NAUSEA MEDICATION *UNUSUAL SHORTNESS OF BREATH *UNUSUAL BRUISING OR BLEEDING *URINARY PROBLEMS (pain or burning when urinating, or frequent urination) *BOWEL PROBLEMS (unusual diarrhea, constipation, pain near the anus) TENDERNESS IN MOUTH AND THROAT WITH OR WITHOUT PRESENCE OF ULCERS (sore throat, sores in mouth, or a toothache) UNUSUAL RASH, SWELLING OR PAIN  UNUSUAL VAGINAL DISCHARGE OR ITCHING   Items with * indicate a potential emergency and should be followed up as soon as possible or go to the Emergency Department if any problems should occur.  Please show the CHEMOTHERAPY ALERT CARD or IMMUNOTHERAPY ALERT CARD at  check-in to the Emergency Department and triage nurse.  Should you have questions after your visit or need to cancel or reschedule your appointment, please contact [facility]  Dept: [phone]  and follow the prompts.  Office hours are 8:00 a.m. to 4:30 p.m. Monday - Friday. Please note that voicemails left after 4:00 p.m. may not be returned until the following business day.  We are closed weekends and major holidays. You have access to a nurse at all times for urgent questions. Please call the main number to the clinic Dept: [phone] and follow the prompts.   For any non-urgent questions, you may also contact your provider using MyChart. We now offer e-Visits for anyone 26 and older to request care online for non-urgent symptoms. For details visit [email or web address].   Also download the MyChart app! Go to the app store, search "MyChart", open the app, select [place], and log in with your MyChart username and password.  Rituximab Injection What is this medication? RITUXIMAB (ri TUX i mab) treats leukemia and lymphoma. It works by blocking a protein that causes cancer cells to grow and multiply. This helps to slow or stop the spread of cancer cells. It may also be used to treat autoimmune conditions, such as arthritis. It works by slowing down an overactive immune system. It is a monoclonal antibody. This medicine may be used for other purposes; ask your health care provider or pharmacist if you have questions. COMMON BRAND NAME(S): RIABNI, Rituxan, RUXIENCE, truxima What should I tell my care team before I take this medication? They need to  know if you have any of these conditions: Chest pain Heart disease Immune system problems Infection, such as chickenpox, cold sores, hepatitis B, herpes Irregular heartbeat or rhythm Kidney disease Low blood counts, such as low white cells, platelets, red cells Lung disease Recent or upcoming vaccine An  unusual or allergic reaction to rituximab, other medications, foods, dyes, or preservatives Pregnant or trying to get pregnant Breast-feeding How should I use this medication? This medication is injected into a vein. It is given by a care team in a hospital or clinic setting. A special MedGuide will be given to you before each treatment. Be sure to read this information carefully each time. Talk to your care team about the use of this medication in children. While this medication may be prescribed for children as young as 6 months for selected conditions, precautions do apply. Overdosage: If you think you have taken too much of this medicine contact a poison control center or emergency room at once. NOTE: This medicine is only for you. Do not share this medicine with others. What if I miss a dose? Keep appointments for follow-up doses. It is important not to miss your dose. Call your care team if you are unable to keep an appointment. What may interact with this medication? Do not take this medication with any of the following: Live vaccines This medication may also interact with the following: Cisplatin This list may not describe all possible interactions. Give your health care provider a list of all the medicines, herbs, non-prescription drugs, or dietary supplements you use. Also tell them if you smoke, drink alcohol, or use illegal drugs. Some items may interact with your medicine. What should I watch for while using this medication? Your condition will be monitored carefully while you are receiving this medication. You may need blood work while taking this medication. This medication can cause serious infusion reactions. To reduce the risk your care team may give you other medications to take before receiving this one. Be sure to follow the directions from your care team. This medication may increase your risk of getting an infection. Call your care team for advice if you get a fever,  chills, sore throat, or other symptoms of a cold or flu. Do not treat yourself. Try to avoid being around people who are sick. Call your care team if you are around anyone with measles, chickenpox, or if you develop sores or blisters that do not heal properly. Avoid taking medications that contain aspirin, acetaminophen, ibuprofen, naproxen, or ketoprofen unless instructed by your care team. These medications may hide a fever. This medication may cause serious skin reactions. They can happen weeks to months after starting the medication. Contact your care team right away if you notice fevers or flu-like symptoms with a rash. The rash may be red or purple and then turn into blisters or peeling of the skin. You may also notice a red rash with swelling of the face, lips, or lymph nodes in your neck or under your arms. In some patients, this medication may cause a serious brain infection that may cause death. If you have any problems seeing, thinking, speaking, walking, or standing, tell your care team right away. If you cannot reach your care team, urgently seek another source of medical care. Talk to your care team if you may be pregnant. Serious birth defects can occur if you take this medication during pregnancy and for 12 months after the last dose. You will need a negative pregnancy test  before starting this medication. Contraception is recommended while taking this medication and for 12 months after the last dose. Your care team can help you find the option that works for you. Do not breastfeed while taking this medication and for at least 6 months after the last dose. What side effects may I notice from receiving this medication? Side effects that you should report to your care team as soon as possible: Allergic reactions or angioedema--skin rash, itching or hives, swelling of the face, eyes, lips, tongue, arms, or legs, trouble swallowing or breathing Bowel blockage--stomach cramping, unable to have a  bowel movement or pass gas, loss of appetite, vomiting Dizziness, loss of balance or coordination, confusion or trouble speaking Heart attack--pain or tightness in the chest, shoulders, arms, or jaw, nausea, shortness of breath, cold or clammy skin, feeling faint or lightheaded Heart rhythm changes--fast or irregular heartbeat, dizziness, feeling faint or lightheaded, chest pain, trouble breathing Infection--fever, chills, cough, sore throat, wounds that don't heal, pain or trouble when passing urine, general feeling of discomfort or being unwell Infusion reactions--chest pain, shortness of breath or trouble breathing, feeling faint or lightheaded Kidney injury--decrease in the amount of urine, swelling of the ankles, hands, or feet Liver injury--right upper belly pain, loss of appetite, nausea, light-colored stool, dark yellow or brown urine, yellowing skin or eyes, unusual weakness or fatigue Redness, blistering, peeling, or loosening of the skin, including inside the mouth Stomach pain that is severe, does not go away, or gets worse Tumor lysis syndrome (TLS)--nausea, vomiting, diarrhea, decrease in the amount of urine, dark urine, unusual weakness or fatigue, confusion, muscle pain or cramps, fast or irregular heartbeat, joint pain Side effects that usually do not require medical attention (report to your care team if they continue or are bothersome): Headache Joint pain Nausea Runny or stuffy nose Unusual weakness or fatigue This list may not describe all possible side effects. Call your doctor for medical advice about side effects. You may report side effects to FDA at 1-800-FDA-1088. Where should I keep my medication? This medication is given in a hospital or clinic. It will not be stored at home. NOTE: This sheet is a summary. It may not cover all possible information. If you have questions about this medicine, talk to your doctor, pharmacist, or health care provider.  2024  Elsevier/Gold Standard (2022-02-16 00:00:00)  Bendamustine Injection What is this medication? BENDAMUSTINE (BEN da MUS teen) treats leukemia and lymphoma. It works by slowing down the growth of cancer cells. This medicine may be used for other purposes; ask your health care provider or pharmacist if you have questions. COMMON BRAND NAME(S): Marilynne Halsted, VIVIMUSTA What should I tell my care team before I take this medication? They need to know if you have any of these conditions: Infection, especially a viral infection, such as chickenpox, cold sores, herpes Kidney disease Liver disease An unusual or allergic reaction to bendamustine, mannitol, other medications, foods, dyes, or preservatives Pregnant or trying to get pregnant Breast-feeding How should I use this medication? This medication is injected into a vein. It is given by your care team in a hospital or clinic setting. Talk to your care team about the use of this medication in children. Special care may be needed. Overdosage: If you think you have taken too much of this medicine contact a poison control center or emergency room at once. NOTE: This medicine is only for you. Do not share this medicine with others. What if I miss a dose?  Keep appointments for follow-up doses. It is important not to miss your dose. Call your care team if you are unable to keep an appointment. What may interact with this medication? Do not take this medication with any of the following: Clozapine This medication may also interact with the following: Atazanavir Cimetidine Ciprofloxacin Enoxacin Fluvoxamine Medications for seizures, such as carbamazepine, phenobarbital Mexiletine Rifampin Tacrine Thiabendazole Zileuton This list may not describe all possible interactions. Give your health care provider a list of all the medicines, herbs, non-prescription drugs, or dietary supplements you use. Also tell them if you smoke, drink  alcohol, or use illegal drugs. Some items may interact with your medicine. What should I watch for while using this medication? Visit your care team for regular checks on your progress. This medication may make you feel generally unwell. This is not uncommon, as chemotherapy can affect healthy cells as well as cancer cells. Report any side effects. Continue your course of treatment even though you feel ill unless your care team tells you to stop. You may need blood work while taking this medication. This medication may increase your risk of getting an infection. Call your care team for advice if you get a fever, chills, sore throat, or other symptoms of a cold or flu. Do not treat yourself. Try to avoid being around people who are sick. This medication may cause serious skin reactions. They can happen weeks to months after starting the medication. Contact your care team right away if you notice fevers or flu-like symptoms with a rash. The rash may be red or purple and then turn into blisters or peeling of the skin. You may also notice a red rash with swelling of the face, lips, or lymph nodes in your neck or under your arms. In some patients, this medication may cause a serious brain infection that may cause death. If you have any problems seeing, thinking, speaking, walking, or standing, tell your care team right away. If you cannot reach your care team, urgently seek other source of medical care. This medication may increase your risk to bruise or bleed. Call your care team if you notice any unusual bleeding. Talk to your care team about your risk of cancer. You may be more at risk for certain types of cancer if you take this medication. Talk to your care team about your risk of skin cancer. You may be more at risk for skin cancer if you take this medication. Talk to your care team if you or your partner wish to become pregnant or think either of you might be pregnant. This medication can cause serious  birth defects if taken during pregnancy or for up to 6 months after the last dose. A negative pregnancy test is required before starting this medication. A reliable form of contraception is recommended while taking this medication and for 6 months after the last dose. Talk to your care team about reliable forms of contraception. Wear a condom while taking this medication and for at least 3 months after the last dose. Do not breast-feed while taking this medication or for at least 1 week after the last dose. This medication may cause infertility. Talk to your care team if you are concerned about your fertility. What side effects may I notice from receiving this medication? Side effects that you should report to your care team as soon as possible: Allergic reactions--skin rash, itching, hives, swelling of the face, lips, tongue, or throat Infection--fever, chills, cough, sore throat, wounds that  don't heal, pain or trouble when passing urine, general feeling of discomfort or being unwell Infusion reactions--chest pain, shortness of breath or trouble breathing, feeling faint or lightheaded Liver injury--right upper belly pain, loss of appetite, nausea, light-colored stool, dark yellow or brown urine, yellowing skin or eyes, unusual weakness or fatigue Low red blood cell level--unusual weakness or fatigue, dizziness, headache, trouble breathing Painful swelling, warmth, or redness of the skin, blisters or sores at the infusion site Rash, fever, and swollen lymph nodes Redness, blistering, peeling, or loosening of the skin, including inside the mouth Tumor lysis syndrome (TLS)--nausea, vomiting, diarrhea, decrease in the amount of urine, dark urine, unusual weakness or fatigue, confusion, muscle pain or cramps, fast or irregular heartbeat, joint pain Unusual bruising or bleeding Side effects that usually do not require medical attention (report to your care team if they continue or are  bothersome): Diarrhea Fatigue Headache Loss of appetite Nausea Vomiting This list may not describe all possible side effects. Call your doctor for medical advice about side effects. You may report side effects to FDA at 1-800-FDA-1088. Where should I keep my medication? This medication is given in a hospital or clinic. It will not be stored at home. NOTE: This sheet is a summary. It may not cover all possible information. If you have questions about this medicine, talk to your doctor, pharmacist, or health care provider.  2024 Elsevier/Gold Standard (2022-01-17 00:00:00)

## 2023-06-13 NOTE — Progress Notes (Signed)
Patient seen by Dr. Truett Perna today  Vitals are within treatment parameters.  Labs reviewed by Dr. Truett Perna and are within treatment parameters.Urine pregnancy pending  Per physician team, patient is ready for treatment and there are NO modifications to the treatment plan.

## 2023-06-13 NOTE — Progress Notes (Signed)
  [facility] OFFICE PROGRESS NOTE   Diagnosis: Non-Hodgkin's lymphoma  INTERVAL HISTORY:   Adrienne Morgan returns as scheduled.  She completed another cycle of Bendamustine/rituximab beginning 05/16/2023.  She reports mild nausea and constipation following chemotherapy.  No rash.  She has mild arthralgias, but did not have acute arthralgias following chemotherapy.  She has an occasional sweat.  She relates this to menopause.  Objective:  Vital signs in last 24 hours:  Blood pressure 108/68, pulse 83, temperature 97.9 F (36.6 C), temperature source Oral, resp. rate 18, height 5\' 3"  (1.6 m), weight 138 lb (62.6 kg), SpO2 98%.    HEENT: No thrush or ulcers Resp: Lungs clear bilaterally Cardio: Regular rate and rhythm GI: No hepatosplenomegaly, nontender, no mass Vascular: No leg edema  Lab Results:  Lab Results  Component Value Date   WBC 3.2 (L) 06/13/2023   HGB 12.7 06/13/2023   HCT 37.4 06/13/2023   MCV 94.0 06/13/2023   PLT 167 06/13/2023   NEUTROABS 2.2 06/13/2023    CMP  Lab Results  Component Value Date   NA 140 06/13/2023   K 3.7 06/13/2023   CL 104 06/13/2023   CO2 30 06/13/2023   GLUCOSE 91 06/13/2023   BUN 12 06/13/2023   CREATININE 0.69 06/13/2023   CALCIUM 9.3 06/13/2023   PROT 7.0 06/13/2023   ALBUMIN 4.3 06/13/2023   AST 15 06/13/2023   ALT 10 06/13/2023   ALKPHOS 63 06/13/2023   BILITOT 0.4 06/13/2023   GFRNONAA >60 06/13/2023   GFRAA >90 06/15/2013    Medications: I have reviewed the patient's current medications.   Assessment/Plan: Follicular lymphoma-low-grade, clinical stage III, FLIPI-intermediate risk Bilateral axillary ultrasound 08/29/2023-mildly prominent lymph nodes, right axillary node without a defined preserved fatty hilum Ultrasound-guided biopsy of right axillary lymph node 12/19/2022-follicular lymphoma, overall grade 1 of 3, focal grade 2, Ki-67 less than 10% with areas of under 5% and areas at 15-20%, CD20, BCL6,  CD10 positive.  Bcl-2 positive.  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 PET 01/04/2023-mild to moderate FDG activity associated with borderline enlarged bilateral axillary, retroperitoneal, mesenteric, right inguinal, and right external iliac nodes, mild diffuse increased uptake in the spleen Cycle 1 Bendamustine/rituximab 01/24/2023 Cycle 2 Bendamustine/rituximab 02/21/2023, day 2 Bendamustine held secondary to fever, arthralgias, and rash Cycle 3 Bendamustine/rituximab 03/20/2023 Cycle 4 Bendamustine/rituximab 04/18/2023 PET 05/11/2023-resolution of hypermetabolic lymph nodes in the neck, chest, abdomen, and pelvis, Deauville 1, resolution of splenic hypermetabolism, no evidence of residual metabolically active lymphoma. Cycle 5 Bendamustine/rituximab 05/16/2023 Cycle 6 Bendamustine/rituximab 06/13/2023 PCOS multiple miscarriages History of a colon polyp-tubular adenoma Family history of colon and esophagus cancer      Disposition: Adrienne Morgan appears stable.  She has completed 5 cycles of Bendamustine/rituximab.  She is in clinical remission from lymphoma.  She will complete cycle 6 today.  She will return for an office and lab visit in approximately 6 weeks.  Thornton Papas, MD  06/13/2023  9:01 AM

## 2023-06-14 ENCOUNTER — Inpatient Hospital Stay: Payer: BC Managed Care – PPO

## 2023-06-14 ENCOUNTER — Other Ambulatory Visit: Payer: Self-pay

## 2023-06-14 VITALS — BP 105/73 | HR 66 | Temp 98.2°F | Resp 18

## 2023-06-14 DIAGNOSIS — E282 Polycystic ovarian syndrome: Secondary | ICD-10-CM | POA: Diagnosis not present

## 2023-06-14 DIAGNOSIS — C8204 Follicular lymphoma grade I, lymph nodes of axilla and upper limb: Secondary | ICD-10-CM | POA: Diagnosis not present

## 2023-06-14 DIAGNOSIS — C8299 Follicular lymphoma, unspecified, extranodal and solid organ sites: Secondary | ICD-10-CM

## 2023-06-14 DIAGNOSIS — Z79899 Other long term (current) drug therapy: Secondary | ICD-10-CM | POA: Diagnosis not present

## 2023-06-14 DIAGNOSIS — Z5111 Encounter for antineoplastic chemotherapy: Secondary | ICD-10-CM | POA: Diagnosis not present

## 2023-06-14 MED ORDER — SODIUM CHLORIDE 0.9 % IV SOLN
90.0000 mg/m2 | Freq: Once | INTRAVENOUS | Status: AC
  Administered 2023-06-14: 150 mg via INTRAVENOUS
  Filled 2023-06-14: qty 6

## 2023-06-14 MED ORDER — SODIUM CHLORIDE 0.9 % IV SOLN
10.0000 mg | Freq: Once | INTRAVENOUS | Status: AC
  Administered 2023-06-14: 10 mg via INTRAVENOUS
  Filled 2023-06-14: qty 10

## 2023-06-14 MED ORDER — FAMOTIDINE IN NACL 20-0.9 MG/50ML-% IV SOLN
20.0000 mg | Freq: Once | INTRAVENOUS | Status: AC
  Administered 2023-06-14: 20 mg via INTRAVENOUS
  Filled 2023-06-14: qty 50

## 2023-06-14 MED ORDER — SODIUM CHLORIDE 0.9 % IV SOLN: Freq: Once | INTRAVENOUS | Status: AC

## 2023-06-14 NOTE — Patient Instructions (Signed)
[facility]  Discharge Instructions: Thank you for choosing [facility] to provide your oncology and hematology care.   If you have a lab appointment with the Cancer Center, please go directly to the Cancer Center and check in at the registration area.   Wear comfortable clothing and clothing appropriate for easy access to any Portacath or PICC line.   We strive to give you quality time with your provider. You may need to reschedule your appointment if you arrive late (15 or more minutes).  Arriving late affects you and other patients whose appointments are after yours.  Also, if you miss three or more appointments without notifying the office, you may be dismissed from the clinic at the provider's discretion.      For prescription refill requests, have your pharmacy contact our office and allow 72 hours for refills to be completed.    Today you received the following chemotherapy and/or immunotherapy agents: bendamustine      To help prevent nausea and vomiting after your treatment, we encourage you to take your nausea medication as directed.  BELOW ARE SYMPTOMS THAT SHOULD BE REPORTED IMMEDIATELY: *FEVER GREATER THAN 100.4 F (38 C) OR HIGHER *CHILLS OR SWEATING *NAUSEA AND VOMITING THAT IS NOT CONTROLLED WITH YOUR NAUSEA MEDICATION *UNUSUAL SHORTNESS OF BREATH *UNUSUAL BRUISING OR BLEEDING *URINARY PROBLEMS (pain or burning when urinating, or frequent urination) *BOWEL PROBLEMS (unusual diarrhea, constipation, pain near the anus) TENDERNESS IN MOUTH AND THROAT WITH OR WITHOUT PRESENCE OF ULCERS (sore throat, sores in mouth, or a toothache) UNUSUAL RASH, SWELLING OR PAIN  UNUSUAL VAGINAL DISCHARGE OR ITCHING   Items with * indicate a potential emergency and should be followed up as soon as possible or go to the Emergency Department if any problems should occur.  Please show the CHEMOTHERAPY ALERT CARD or IMMUNOTHERAPY ALERT CARD at  check-in to the Emergency Department and triage nurse.  Should you have questions after your visit or need to cancel or reschedule your appointment, please contact [facility]  Dept: [phone]  and follow the prompts.  Office hours are 8:00 a.m. to 4:30 p.m. Monday - Friday. Please note that voicemails left after 4:00 p.m. may not be returned until the following business day.  We are closed weekends and major holidays. You have access to a nurse at all times for urgent questions. Please call the main number to the clinic Dept: [phone] and follow the prompts.   For any non-urgent questions, you may also contact your provider using MyChart. We now offer e-Visits for anyone 18 and older to request care online for non-urgent symptoms. For details visit [email or web address].   Also download the MyChart app! Go to the app store, search "MyChart", open the app, select [facility], and log in with your MyChart username and password.  Bendamustine Injection What is this medication? BENDAMUSTINE (BEN da MUS teen) treats leukemia and lymphoma. It works by slowing down the growth of cancer cells. This medicine may be used for other purposes; ask your health care provider or pharmacist if you have questions. COMMON BRAND NAME(S): BELRAPZO, BENDEKA, Treanda, VIVIMUSTA What should I tell my care team before I take this medication? They need to know if you have any of these conditions: Infection, especially a viral infection, such as chickenpox, cold sores, herpes Kidney disease Liver disease An unusual or allergic reaction to bendamustine, mannitol, other medications, foods, dyes, or preservatives Pregnant or trying to get   pregnant Breast-feeding How should I use this medication? This medication is injected into a vein. It is given by your care team in a hospital or clinic setting. Talk to your care team about the use of this medication in children. Special care  may be needed. Overdosage: If you think you have taken too much of this medicine contact a poison control center or emergency room at once. NOTE: This medicine is only for you. Do not share this medicine with others. What if I miss a dose? Keep appointments for follow-up doses. It is important not to miss your dose. Call your care team if you are unable to keep an appointment. What may interact with this medication? Do not take this medication with any of the following: Clozapine This medication may also interact with the following: Atazanavir Cimetidine Ciprofloxacin Enoxacin Fluvoxamine Medications for seizures, such as carbamazepine, phenobarbital Mexiletine Rifampin Tacrine Thiabendazole Zileuton This list may not describe all possible interactions. Give your health care provider a list of all the medicines, herbs, non-prescription drugs, or dietary supplements you use. Also tell them if you smoke, drink alcohol, or use illegal drugs. Some items may interact with your medicine. What should I watch for while using this medication? Visit your care team for regular checks on your progress. This medication may make you feel generally unwell. This is not uncommon, as chemotherapy can affect healthy cells as well as cancer cells. Report any side effects. Continue your course of treatment even though you feel ill unless your care team tells you to stop. You may need blood work while taking this medication. This medication may increase your risk of getting an infection. Call your care team for advice if you get a fever, chills, sore throat, or other symptoms of a cold or flu. Do not treat yourself. Try to avoid being around people who are sick. This medication may cause serious skin reactions. They can happen weeks to months after starting the medication. Contact your care team right away if you notice fevers or flu-like symptoms with a rash. The rash may be red or purple and then turn into  blisters or peeling of the skin. You may also notice a red rash with swelling of the face, lips, or lymph nodes in your neck or under your arms. In some patients, this medication may cause a serious brain infection that may cause death. If you have any problems seeing, thinking, speaking, walking, or standing, tell your care team right away. If you cannot reach your care team, urgently seek other source of medical care. This medication may increase your risk to bruise or bleed. Call your care team if you notice any unusual bleeding. Talk to your care team about your risk of cancer. You may be more at risk for certain types of cancer if you take this medication. Talk to your care team about your risk of skin cancer. You may be more at risk for skin cancer if you take this medication. Talk to your care team if you or your partner wish to become pregnant or think either of you might be pregnant. This medication can cause serious birth defects if taken during pregnancy or for up to 6 months after the last dose. A negative pregnancy test is required before starting this medication. A reliable form of contraception is recommended while taking this medication and for 6 months after the last dose. Talk to your care team about reliable forms of contraception. Wear a condom while taking this medication   and for at least 3 months after the last dose. Do not breast-feed while taking this medication or for at least 1 week after the last dose. This medication may cause infertility. Talk to your care team if you are concerned about your fertility. What side effects may I notice from receiving this medication? Side effects that you should report to your care team as soon as possible: Allergic reactions--skin rash, itching, hives, swelling of the face, lips, tongue, or throat Infection--fever, chills, cough, sore throat, wounds that don't heal, pain or trouble when passing urine, general feeling of discomfort or being  unwell Infusion reactions--chest pain, shortness of breath or trouble breathing, feeling faint or lightheaded Liver injury--right upper belly pain, loss of appetite, nausea, light-colored stool, dark yellow or brown urine, yellowing skin or eyes, unusual weakness or fatigue Low red blood cell level--unusual weakness or fatigue, dizziness, headache, trouble breathing Painful swelling, warmth, or redness of the skin, blisters or sores at the infusion site Rash, fever, and swollen lymph nodes Redness, blistering, peeling, or loosening of the skin, including inside the mouth Tumor lysis syndrome (TLS)--nausea, vomiting, diarrhea, decrease in the amount of urine, dark urine, unusual weakness or fatigue, confusion, muscle pain or cramps, fast or irregular heartbeat, joint pain Unusual bruising or bleeding Side effects that usually do not require medical attention (report to your care team if they continue or are bothersome): Diarrhea Fatigue Headache Loss of appetite Nausea Vomiting This list may not describe all possible side effects. Call your doctor for medical advice about side effects. You may report side effects to FDA at 1-800-FDA-1088. Where should I keep my medication? This medication is given in a hospital or clinic. It will not be stored at home. NOTE: This sheet is a summary. It may not cover all possible information. If you have questions about this medicine, talk to your doctor, pharmacist, or health care provider.  2024 [employer]/Gold Standard (2022-01-17 00:00:00)   

## 2023-06-22 ENCOUNTER — Encounter: Payer: Self-pay | Admitting: Oncology

## 2023-06-22 DIAGNOSIS — C8299 Follicular lymphoma, unspecified, extranodal and solid organ sites: Secondary | ICD-10-CM | POA: Diagnosis not present

## 2023-06-22 DIAGNOSIS — R0981 Nasal congestion: Secondary | ICD-10-CM | POA: Diagnosis not present

## 2023-06-22 DIAGNOSIS — R051 Acute cough: Secondary | ICD-10-CM | POA: Diagnosis not present

## 2023-07-03 DIAGNOSIS — Z30431 Encounter for routine checking of intrauterine contraceptive device: Secondary | ICD-10-CM | POA: Diagnosis not present

## 2023-07-03 DIAGNOSIS — R87612 Low grade squamous intraepithelial lesion on cytologic smear of cervix (LGSIL): Secondary | ICD-10-CM | POA: Diagnosis not present

## 2023-07-03 DIAGNOSIS — R87610 Atypical squamous cells of undetermined significance on cytologic smear of cervix (ASC-US): Secondary | ICD-10-CM | POA: Diagnosis not present

## 2023-07-30 ENCOUNTER — Encounter: Payer: Self-pay | Admitting: Oncology

## 2023-07-30 ENCOUNTER — Inpatient Hospital Stay (HOSPITAL_BASED_OUTPATIENT_CLINIC_OR_DEPARTMENT_OTHER): Payer: BC Managed Care – PPO | Admitting: Oncology

## 2023-07-30 ENCOUNTER — Inpatient Hospital Stay: Payer: BC Managed Care – PPO | Attending: Oncology

## 2023-07-30 VITALS — BP 117/74 | HR 94 | Temp 98.1°F | Resp 18 | Ht 63.0 in | Wt 140.9 lb

## 2023-07-30 DIAGNOSIS — Z860101 Personal history of adenomatous and serrated colon polyps: Secondary | ICD-10-CM

## 2023-07-30 DIAGNOSIS — C8204 Follicular lymphoma grade I, lymph nodes of axilla and upper limb: Secondary | ICD-10-CM | POA: Diagnosis not present

## 2023-07-30 DIAGNOSIS — E282 Polycystic ovarian syndrome: Secondary | ICD-10-CM | POA: Insufficient documentation

## 2023-07-30 DIAGNOSIS — Z800 Family history of malignant neoplasm of digestive organs: Secondary | ICD-10-CM | POA: Insufficient documentation

## 2023-07-30 DIAGNOSIS — C8299 Follicular lymphoma, unspecified, extranodal and solid organ sites: Secondary | ICD-10-CM

## 2023-07-30 LAB — CMP (CANCER CENTER ONLY)
ALT: 12 U/L (ref 0–44)
AST: 18 U/L (ref 15–41)
Albumin: 4.6 g/dL (ref 3.5–5.0)
Alkaline Phosphatase: 75 U/L (ref 38–126)
Anion gap: 6 (ref 5–15)
BUN: 6 mg/dL (ref 6–20)
CO2: 29 mmol/L (ref 22–32)
Calcium: 9.8 mg/dL (ref 8.9–10.3)
Chloride: 104 mmol/L (ref 98–111)
Creatinine: 0.73 mg/dL (ref 0.44–1.00)
GFR, Estimated: >60 mL/min (ref >60)
Glucose, Bld: 115 mg/dL — ABNORMAL HIGH (ref 70–99)
Potassium: 3.8 mmol/L (ref 3.5–5.1)
Sodium: 139 mmol/L (ref 135–145)
Total Bilirubin: 0.4 mg/dL (ref 0.3–1.2)
Total Protein: 7.5 g/dL (ref 6.5–8.1)

## 2023-07-30 LAB — CBC WITH DIFFERENTIAL (CANCER CENTER ONLY)
Abs Immature Granulocytes: 0.03 10*3/uL (ref 0.00–0.07)
Basophils Absolute: 0 10*3/uL (ref 0.0–0.1)
Basophils Relative: 1 %
Eosinophils Absolute: 0.1 10*3/uL (ref 0.0–0.5)
Eosinophils Relative: 4 %
HCT: 35 % — ABNORMAL LOW (ref 36.0–46.0)
Hemoglobin: 12.1 g/dL (ref 12.0–15.0)
Immature Granulocytes: 1 %
Lymphocytes Relative: 11 %
Lymphs Abs: 0.4 10*3/uL — ABNORMAL LOW (ref 0.7–4.0)
MCH: 32.6 pg (ref 26.0–34.0)
MCHC: 34.6 g/dL (ref 30.0–36.0)
MCV: 94.3 fL (ref 80.0–100.0)
Monocytes Absolute: 0.6 10*3/uL (ref 0.1–1.0)
Monocytes Relative: 17 %
Neutro Abs: 2.3 10*3/uL (ref 1.7–7.7)
Neutrophils Relative %: 66 %
Platelet Count: 188 10*3/uL (ref 150–400)
RBC: 3.71 MIL/uL — ABNORMAL LOW (ref 3.87–5.11)
RDW: 12.3 % (ref 11.5–15.5)
WBC Count: 3.5 10*3/uL — ABNORMAL LOW (ref 4.0–10.5)
nRBC: 0 % (ref 0.0–0.2)

## 2023-07-30 LAB — LACTATE DEHYDROGENASE: LDH: 171 U/L (ref 98–192)

## 2023-07-30 NOTE — Progress Notes (Signed)
  [name]   Diagnosis: Non-Hodgkin's lymphoma  INTERVAL HISTORY:   [name] completed a final planned cycle of Bendamustine/rituximab 06/13/2023.  She reports tolerating the treatment well.  She feels well in general.  No fever or night sweats.  No palpable lymph nodes.  She is working. She reports sinus congestion for the past 4-6 weeks.  She completed a course of antibiotics but she has persistent upper airway congestion.  No fever or dyspnea.  Objective:  Vital signs in last 24 hours:  Blood pressure 117/74, pulse 94, temperature 98.1 F (36.7 C), temperature source Temporal, resp. rate 18, height 5\' 3"  (1.6 m), weight 140 lb 14.4 oz (63.9 kg), SpO2 99%.    HEENT: No sinus tenderness, no thrush or ulcers Lymphatics: No cervical, supraclavicular, axillary, or inguinal nodes Resp: Lungs clear bilaterally, no respiratory distress Cardio: Regular rate and rhythm GI: No hepatosplenomegaly Vascular: No leg edema   Lab Results:  Lab Results  Component Value Date   WBC 3.5 (L) 07/30/2023   HGB 12.1 07/30/2023   HCT 35.0 (L) 07/30/2023   MCV 94.3 07/30/2023   PLT 188 07/30/2023   NEUTROABS 2.3 07/30/2023    CMP  Lab Results  Component Value Date   NA 139 07/30/2023   K 3.8 07/30/2023   CL 104 07/30/2023   CO2 29 07/30/2023   GLUCOSE 115 (H) 07/30/2023   BUN 6 07/30/2023   CREATININE 0.73 07/30/2023   CALCIUM 9.8 07/30/2023   PROT 7.5 07/30/2023   ALBUMIN 4.6 07/30/2023   AST 18 07/30/2023   ALT 12 07/30/2023   ALKPHOS 75 07/30/2023   BILITOT 0.4 07/30/2023   GFRNONAA >60 07/30/2023   GFRAA >90 06/15/2013    Medications: I have reviewed the patient's current medications.   Assessment/Plan: Follicular lymphoma-low-grade, clinical stage III, FLIPI-intermediate risk Bilateral axillary ultrasound 08/29/2023-mildly prominent lymph nodes, right axillary node without a defined preserved fatty hilum Ultrasound-guided biopsy of  right axillary lymph node 12/19/2022-follicular lymphoma, overall grade 1 of 3, focal grade 2, Ki-67 less than 10% with areas of under 5% and areas at 15-20%, CD20, BCL6, CD10 positive.  Bcl-2 positive.  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 PET 01/04/2023-mild to moderate FDG activity associated with borderline enlarged bilateral axillary, retroperitoneal, mesenteric, right inguinal, and right external iliac nodes, mild diffuse increased uptake in the spleen Cycle 1 Bendamustine/rituximab 01/24/2023 Cycle 2 Bendamustine/rituximab 02/21/2023, day 2 Bendamustine held secondary to fever, arthralgias, and rash Cycle 3 Bendamustine/rituximab 03/20/2023 Cycle 4 Bendamustine/rituximab 04/18/2023 PET 05/11/2023-resolution of hypermetabolic lymph nodes in the neck, chest, abdomen, and pelvis, Deauville 1, resolution of splenic hypermetabolism, no evidence of residual metabolically active lymphoma. Cycle 5 Bendamustine/rituximab 05/16/2023 Cycle 6 Bendamustine/rituximab 06/13/2023 PCOS multiple miscarriages History of a colon polyp-tubular adenoma Family history of colon and esophagus cancer, and sister with non-Hodgkin's lymphoma        Disposition: Adrienne Morgan is in clinical remission from non-Hodgkin's lymphoma.  She will return for an office and lab visit in 3 months.  She will seek medical attention for a fever or dyspnea.  I suspect she has a slowly resolving viral upper respiratory infection.  [name] sister has been diagnosed with follicular lymphoma.  It is unlikely that her lymphoma is part of a familial syndrome.  We will discuss the case with our genetics counselor.  Thornton Papas, MD  07/30/2023  9:12 AM

## 2023-07-31 ENCOUNTER — Other Ambulatory Visit: Payer: Self-pay

## 2023-08-20 ENCOUNTER — Other Ambulatory Visit: Payer: Self-pay

## 2023-08-20 ENCOUNTER — Ambulatory Visit (HOSPITAL_BASED_OUTPATIENT_CLINIC_OR_DEPARTMENT_OTHER)
Admission: RE | Admit: 2023-08-20 | Discharge: 2023-08-20 | Disposition: A | Discharge status: Home / Self Care | Payer: BC Managed Care – PPO | Source: Ambulatory Visit | Attending: Oncology | Admitting: Oncology

## 2023-08-20 DIAGNOSIS — C8299 Follicular lymphoma, unspecified, extranodal and solid organ sites: Secondary | ICD-10-CM

## 2023-08-20 DIAGNOSIS — C8100 Nodular lymphocyte predominant Hodgkin lymphoma, unspecified site: Secondary | ICD-10-CM | POA: Diagnosis not present

## 2023-08-22 ENCOUNTER — Telehealth: Payer: Self-pay | Admitting: *Deleted

## 2023-08-22 NOTE — Telephone Encounter (Signed)
Notified Tahirah that CXR was negative. Reports persistent cough for ~ 5 weeks. Encouraged her to call PCP.

## 2023-08-29 ENCOUNTER — Encounter: Payer: Self-pay | Admitting: Internal Medicine

## 2023-09-02 DIAGNOSIS — H6692 Otitis media, unspecified, left ear: Secondary | ICD-10-CM | POA: Diagnosis not present

## 2023-09-11 ENCOUNTER — Encounter: Payer: Self-pay | Admitting: Oncology

## 2023-09-11 DIAGNOSIS — J189 Pneumonia, unspecified organism: Secondary | ICD-10-CM | POA: Diagnosis not present

## 2023-09-11 DIAGNOSIS — R058 Other specified cough: Secondary | ICD-10-CM | POA: Diagnosis not present

## 2023-09-20 DIAGNOSIS — H938X2 Other specified disorders of left ear: Secondary | ICD-10-CM | POA: Diagnosis not present

## 2023-09-20 DIAGNOSIS — H9192 Unspecified hearing loss, left ear: Secondary | ICD-10-CM | POA: Diagnosis not present

## 2023-09-21 DIAGNOSIS — H6992 Unspecified Eustachian tube disorder, left ear: Secondary | ICD-10-CM | POA: Diagnosis not present

## 2023-09-25 DIAGNOSIS — J189 Pneumonia, unspecified organism: Secondary | ICD-10-CM | POA: Diagnosis not present

## 2023-09-25 DIAGNOSIS — J309 Allergic rhinitis, unspecified: Secondary | ICD-10-CM | POA: Diagnosis not present

## 2023-09-29 DIAGNOSIS — F4323 Adjustment disorder with mixed anxiety and depressed mood: Secondary | ICD-10-CM | POA: Diagnosis not present

## 2023-10-01 ENCOUNTER — Encounter: Payer: Self-pay | Admitting: Internal Medicine

## 2023-10-01 DIAGNOSIS — H6992 Unspecified Eustachian tube disorder, left ear: Secondary | ICD-10-CM | POA: Diagnosis not present

## 2023-10-15 ENCOUNTER — Telehealth: Payer: Self-pay | Admitting: *Deleted

## 2023-10-15 ENCOUNTER — Encounter: Payer: Self-pay | Admitting: Oncology

## 2023-10-15 DIAGNOSIS — J189 Pneumonia, unspecified organism: Secondary | ICD-10-CM | POA: Diagnosis not present

## 2023-10-15 NOTE — Telephone Encounter (Signed)
 Shared MyChart message with Dr. Truett Perna. He will see her tomorrow at 20. Patient agrees.

## 2023-10-16 ENCOUNTER — Other Ambulatory Visit: Payer: Self-pay | Admitting: Nurse Practitioner

## 2023-10-16 ENCOUNTER — Ambulatory Visit (HOSPITAL_BASED_OUTPATIENT_CLINIC_OR_DEPARTMENT_OTHER)
Admission: RE | Admit: 2023-10-16 | Discharge: 2023-10-16 | Disposition: A | Discharge status: Home / Self Care | Payer: BC Managed Care – PPO | Source: Ambulatory Visit | Attending: Internal Medicine | Admitting: Internal Medicine

## 2023-10-16 ENCOUNTER — Other Ambulatory Visit (HOSPITAL_BASED_OUTPATIENT_CLINIC_OR_DEPARTMENT_OTHER): Payer: Self-pay | Admitting: Internal Medicine

## 2023-10-16 ENCOUNTER — Inpatient Hospital Stay: Payer: BC Managed Care – PPO

## 2023-10-16 ENCOUNTER — Inpatient Hospital Stay: Payer: BC Managed Care – PPO | Attending: Oncology | Admitting: Oncology

## 2023-10-16 VITALS — BP 105/71 | HR 97 | Temp 98.9°F | Resp 18

## 2023-10-16 VITALS — BP 124/80 | HR 100 | Temp 97.9°F | Resp 18 | Ht 63.0 in | Wt 132.1 lb

## 2023-10-16 DIAGNOSIS — H6692 Otitis media, unspecified, left ear: Secondary | ICD-10-CM | POA: Diagnosis not present

## 2023-10-16 DIAGNOSIS — J189 Pneumonia, unspecified organism: Secondary | ICD-10-CM | POA: Insufficient documentation

## 2023-10-16 DIAGNOSIS — C8299 Follicular lymphoma, unspecified, extranodal and solid organ sites: Secondary | ICD-10-CM

## 2023-10-16 DIAGNOSIS — Z9221 Personal history of antineoplastic chemotherapy: Secondary | ICD-10-CM | POA: Diagnosis not present

## 2023-10-16 DIAGNOSIS — R918 Other nonspecific abnormal finding of lung field: Secondary | ICD-10-CM | POA: Diagnosis not present

## 2023-10-16 DIAGNOSIS — R509 Fever, unspecified: Secondary | ICD-10-CM | POA: Insufficient documentation

## 2023-10-16 DIAGNOSIS — R110 Nausea: Secondary | ICD-10-CM | POA: Diagnosis not present

## 2023-10-16 DIAGNOSIS — Z8572 Personal history of non-Hodgkin lymphomas: Secondary | ICD-10-CM | POA: Diagnosis not present

## 2023-10-16 DIAGNOSIS — R053 Chronic cough: Secondary | ICD-10-CM | POA: Diagnosis not present

## 2023-10-16 LAB — CBC WITH DIFFERENTIAL (CANCER CENTER ONLY)
Abs Immature Granulocytes: 0.07 10*3/uL (ref 0.00–0.07)
Basophils Absolute: 0 10*3/uL (ref 0.0–0.1)
Basophils Relative: 1 %
Eosinophils Absolute: 0.1 10*3/uL (ref 0.0–0.5)
Eosinophils Relative: 2 %
HCT: 35.7 % — ABNORMAL LOW (ref 36.0–46.0)
Hemoglobin: 11.7 g/dL — ABNORMAL LOW (ref 12.0–15.0)
Immature Granulocytes: 1 %
Lymphocytes Relative: 5 %
Lymphs Abs: 0.3 10*3/uL — ABNORMAL LOW (ref 0.7–4.0)
MCH: 29.8 pg (ref 26.0–34.0)
MCHC: 32.8 g/dL (ref 30.0–36.0)
MCV: 91.1 fL (ref 80.0–100.0)
Monocytes Absolute: 0.7 10*3/uL (ref 0.1–1.0)
Monocytes Relative: 10 %
Neutro Abs: 5.2 10*3/uL (ref 1.7–7.7)
Neutrophils Relative %: 81 %
Platelet Count: 391 10*3/uL (ref 150–400)
RBC: 3.92 MIL/uL (ref 3.87–5.11)
RDW: 12.5 % (ref 11.5–15.5)
WBC Count: 6.4 10*3/uL (ref 4.0–10.5)
nRBC: 0 % (ref 0.0–0.2)

## 2023-10-16 LAB — CMP (CANCER CENTER ONLY)
ALT: 15 U/L (ref 0–44)
AST: 18 U/L (ref 15–41)
Albumin: 4.3 g/dL (ref 3.5–5.0)
Alkaline Phosphatase: 63 U/L (ref 38–126)
Anion gap: 11 (ref 5–15)
BUN: 8 mg/dL (ref 6–20)
CO2: 28 mmol/L (ref 22–32)
Calcium: 9.8 mg/dL (ref 8.9–10.3)
Chloride: 100 mmol/L (ref 98–111)
Creatinine: 0.62 mg/dL (ref 0.44–1.00)
GFR, Estimated: >60 mL/min (ref >60)
Glucose, Bld: 128 mg/dL — ABNORMAL HIGH (ref 70–99)
Potassium: 3.7 mmol/L (ref 3.5–5.1)
Sodium: 139 mmol/L (ref 135–145)
Total Bilirubin: 0.5 mg/dL (ref 0.0–1.2)
Total Protein: 7.8 g/dL (ref 6.5–8.1)

## 2023-10-16 LAB — LACTATE DEHYDROGENASE: LDH: 214 U/L — ABNORMAL HIGH (ref 98–192)

## 2023-10-16 MED ORDER — SODIUM CHLORIDE 0.9 % IV SOLN
2.0000 g | INTRAVENOUS | Status: DC
  Administered 2023-10-16: 2 g via INTRAVENOUS
  Filled 2023-10-16: qty 20

## 2023-10-16 MED ORDER — DEXTROSE 5 % IV SOLN: 2.0000 g | INTRAVENOUS | Status: DC

## 2023-10-16 MED ORDER — SODIUM CHLORIDE 0.9 % IV SOLN
INTRAVENOUS | Status: DC
Start: 2023-10-16 — End: 2023-10-16

## 2023-10-16 MED ORDER — IOHEXOL 300 MG/ML  SOLN
100.0000 mL | Freq: Once | INTRAMUSCULAR | Status: AC | PRN
Start: 2023-10-16 — End: 2023-10-16
  Administered 2023-10-16: 75 mL via INTRAVENOUS

## 2023-10-16 NOTE — Progress Notes (Signed)
 [facility] OFFICE PROGRESS NOTE   Diagnosis: Non-Hodgkin's lymphoma  INTERVAL HISTORY:   Adrienne Morgan returns prior to a scheduled visit.  She developed sinus congestion beginning in September.  She experienced initial improvement after 10 days of Levaquin.  Her symptoms progressed in October as she completed another 7-day course of Levaquin beginning 07/30/2023.  She had a persistent cough and completed 7 days of prednisone beginning 08/27/2023.  The cough did not improve with prednisone.  She began cefdinir for 10 days 09/02/2023.  Her symptoms persisted.  She reports a chest x-ray via Dr. Zadie office on 09/11/2023 revealed bilateral lower lung infiltrates.  She completed 10 more days of Levaquin with some improvement.  A repeat chest x-ray 09/25/2023 revealed improvement in the left lung infiltrate but a persistent right lower lung infiltrate. Her symptoms have progressed over the past 2 weeks with a cough, low-grade fever, sweats, anorexia, and nausea.  No hemoptysis.  She had a fever of 101.5 degrees last weekend.  She has mild exertional dyspnea.  She is working.  She developed left ear pain in November with a hearing loss.  She saw ENT and underwent placement of a tympanostomy tube 10/01/2023.  She reports drainage of pus from the left ear.    Objective:  Vital signs in last 24 hours:  Blood pressure 124/80, pulse 100, temperature 97.9 F (36.6 C), temperature source Temporal, resp. rate 18, height 5' 3 (1.6 m), weight 132 lb 1.6 oz (59.9 kg), SpO2 98%.    HEENT: Left tympanic membrane tube, no drainage, both external canals are clear. Lymphatics: No cervical, supraclavicular, axillary, or inguinal nodes Resp: End inspiratory Rales/rhonchi at the lower posterior chest bilaterally Cardio: Regular rate and rhythm, tachycardia GI: No hepatosplenomegaly Vascular: No leg edema   Lab Results:  Lab Results  Component Value Date   WBC 3.5 (L) 07/30/2023   HGB  12.1 07/30/2023   HCT 35.0 (L) 07/30/2023   MCV 94.3 07/30/2023   PLT 188 07/30/2023   NEUTROABS 2.3 07/30/2023    CMP  Lab Results  Component Value Date   NA 139 07/30/2023   K 3.8 07/30/2023   CL 104 07/30/2023   CO2 29 07/30/2023   GLUCOSE 115 (H) 07/30/2023   BUN 6 07/30/2023   CREATININE 0.73 07/30/2023   CALCIUM  9.8 07/30/2023   PROT 7.5 07/30/2023   ALBUMIN 4.6 07/30/2023   AST 18 07/30/2023   ALT 12 07/30/2023   ALKPHOS 75 07/30/2023   BILITOT 0.4 07/30/2023   GFRNONAA >60 07/30/2023   GFRAA >90 06/15/2013    No results found for: CEA1, CEA, CAN199, CA125  No results found for: INR, LABPROT  Imaging:  No results found.  Medications: I have reviewed the patient's current medications.   Assessment/Plan: Follicular lymphoma-low-grade, clinical stage III, FLIPI-intermediate risk Bilateral axillary ultrasound 08/29/2023-mildly prominent lymph nodes, right axillary node without a defined preserved fatty hilum Ultrasound-guided biopsy of right axillary lymph node 12/19/2022-follicular lymphoma, overall grade 1 of 3, focal grade 2, Ki-67 less than 10% with areas of under 5% and areas at 15-20%, CD20, BCL6, CD10 positive.  Bcl-2 positive.  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 PET 01/04/2023-mild to moderate FDG activity associated with borderline enlarged bilateral axillary, retroperitoneal, mesenteric, right inguinal, and right external iliac nodes, mild diffuse increased uptake in the spleen Cycle 1 Bendamustine /rituximab  01/24/2023 Cycle 2 Bendamustine /rituximab  02/21/2023, day 2 Bendamustine  held secondary to fever, arthralgias, and rash Cycle 3 Bendamustine /rituximab  03/20/2023 Cycle 4 Bendamustine /rituximab  04/18/2023 PET 05/11/2023-resolution  of hypermetabolic lymph nodes in the neck, chest, abdomen, and pelvis, Deauville 1, resolution of splenic hypermetabolism, no evidence of residual metabolically active lymphoma. Cycle 5  Bendamustine /rituximab  05/16/2023 Cycle 6 Bendamustine /rituximab  06/13/2023 PCOS multiple miscarriages History of a colon polyp-tubular adenoma Family history of colon and esophagus cancer, and sister with non-Hodgkin's lymphoma Upper respiratory infection beginning September 2024, multiple courses of Levaquin, treatment with prednisone, course of cefdinir, report of x-rays via primary provider's office showing lower lung infiltrates CT chest 10/16/2023: Dense consolidation in the bilateral lung base, no lymphadenopathy 6.  Left ear infection-status post left myringotomy with tube placement 10/01/2023    Disposition: Ms. Shimkus is an clinical remission from non-Hodgkin's lymphoma.  She completed cycle 6 Bendamustine /rituximab  06/13/2023.  She has developed a cough for the past several months.  She reports a low-grade fever, anorexia, and exertional dyspnea for the past few weeks.  The CT chest today is consistent with bilateral lower lung pneumonia.  She has completed multiple courses of antibiotics and a week of prednisone.  The CT is not suggestive of pneumonitis or atypical pneumonia.  I reviewed the CT images with pulmonary medicine.  A course of IV antibiotics is recommended.  We will prescribe IV ceftriaxone .  Will check a sputum sample culture.  We will check on her later this week.  We will have a low threshold for hospital admission.  Pulmonary medicine will proceed with a bronchoscopy if her clinical status does not improve with the IV ceftriaxone .  She will follow-up with ENT for continued management of the left ear infection.    Arley Hof, MD  10/16/2023  8:53 AM

## 2023-10-16 NOTE — Patient Instructions (Signed)
Ceftriaxone Injection What is this medication? CEFTRIAXONE (sef try AX one) treats infections caused by bacteria. It belongs to a group of medications called cephalosporin antibiotics. It will not treat colds, the flu, or infections caused by viruses. This medicine may be used for other purposes; ask your health care provider or pharmacist if you have questions. COMMON BRAND NAME(S): Ceftri-IM, Ceftrisol Plus, Rocephin What should I tell my care team before I take this medication? They need to know if you have any of these conditions: Bleeding disorder High bilirubin level in newborn patients Kidney disease Liver disease Poor nutrition An unusual or allergic reaction to ceftriaxone, other penicillin or cephalosporin antibiotics, other medications, foods, dyes, or preservatives Pregnant or trying to get pregnant Breast-feeding How should I use this medication? This medication is injected into a vein or a muscle. It is usually given by your care team in a hospital or clinic setting. It may also be given at home. If you get this medication at home, you will be taught how to prepare and give it. Use exactly as directed. Take it as directed on the prescription label at the same time every day. Keep taking it even if you think you are better. It is important that you put your used needles and syringes in a special sharps container. Do not put them in a trash can. If you do not have a sharps container, call your pharmacist or care team to get one. Talk to your care team about the use of this medication in children. While it may be prescribed for children as young as newborns for selected conditions, precautions do apply. Overdosage: If you think you have taken too much of this medicine contact a poison control center or emergency room at once. NOTE: This medicine is only for you. Do not share this medicine with others. What if I miss a dose? If you get this medication at the hospital or clinic: It is  important not to miss your dose. Call your care team if you are unable to keep an appointment. If you give yourself this medication at home: If you miss a dose, take it as soon as you can. Then continue your normal schedule. If it is almost time for your next dose, take only that dose. Do not take double or extra doses. Call your care team with questions. What may interact with this medication? Estrogen or progestin hormones Intravenous calcium This list may not describe all possible interactions. Give your health care provider a list of all the medicines, herbs, non-prescription drugs, or dietary supplements you use. Also tell them if you smoke, drink alcohol, or use illegal drugs. Some items may interact with your medicine. What should I watch for while using this medication? Tell your care team if your symptoms do not start to get better or if they get worse. Do not treat diarrhea with over the counter products. Contact your care team if you have diarrhea that lasts more than 2 days or if it is severe and watery. If you have diabetes, you may get a false-positive result for sugar in your urine. Check with your care team. If you are being treated for a sexually transmitted infection (STI), avoid sexual contact until you have finished your treatment. Your partner may also need treatment. What side effects may I notice from receiving this medication? Side effects that you should report to your care team as soon as possible: Allergic reactions--skin rash, itching, hives, swelling of the face, lips, tongue, or  throat Hemolytic anemia--unusual weakness or fatigue, dizziness, headache, trouble breathing, dark urine, yellowing skin or eyes Severe diarrhea, fever Unusual vaginal discharge, itching, or odor Side effects that usually do not require medical attention (report to your care team if they continue or are bothersome): Diarrhea Headache Nausea Pain, redness, or irritation at injection  site This list may not describe all possible side effects. Call your doctor for medical advice about side effects. You may report side effects to FDA at 1-800-FDA-1088. Where should I keep my medication? Keep out of the reach of children and pets. You will be instructed on how to store this medication. Get rid of any unused medication after the expiration date. To get rid of medications that are no longer needed or have expired: Take the medication to a medication take-back program. Check with your pharmacy or law enforcement to find a location. If you cannot return the medication, ask your pharmacist or care team how to get rid of this medication safely. NOTE: This sheet is a summary. It may not cover all possible information. If you have questions about this medicine, talk to your doctor, pharmacist, or health care provider.  2024 Elsevier/Gold Standard (2021-12-05 00:00:00)

## 2023-10-17 ENCOUNTER — Inpatient Hospital Stay: Payer: BC Managed Care – PPO

## 2023-10-17 VITALS — BP 101/67 | HR 97 | Temp 98.6°F | Resp 18

## 2023-10-17 DIAGNOSIS — H6692 Otitis media, unspecified, left ear: Secondary | ICD-10-CM | POA: Diagnosis not present

## 2023-10-17 DIAGNOSIS — R509 Fever, unspecified: Secondary | ICD-10-CM | POA: Diagnosis not present

## 2023-10-17 DIAGNOSIS — R053 Chronic cough: Secondary | ICD-10-CM | POA: Diagnosis not present

## 2023-10-17 DIAGNOSIS — J189 Pneumonia, unspecified organism: Secondary | ICD-10-CM

## 2023-10-17 DIAGNOSIS — R110 Nausea: Secondary | ICD-10-CM | POA: Diagnosis not present

## 2023-10-17 DIAGNOSIS — Z8572 Personal history of non-Hodgkin lymphomas: Secondary | ICD-10-CM | POA: Diagnosis not present

## 2023-10-17 DIAGNOSIS — Z9221 Personal history of antineoplastic chemotherapy: Secondary | ICD-10-CM | POA: Diagnosis not present

## 2023-10-17 MED ORDER — SODIUM CHLORIDE 0.9 % IV SOLN: Freq: Once | INTRAVENOUS | Status: AC

## 2023-10-17 MED ORDER — SODIUM CHLORIDE 0.9 % IV SOLN
2.0000 g | INTRAVENOUS | Status: DC
  Administered 2023-10-17: 2 g via INTRAVENOUS
  Filled 2023-10-17: qty 2

## 2023-10-17 NOTE — Patient Instructions (Signed)
Ceftriaxone Injection What is this medication? CEFTRIAXONE (sef try AX one) treats infections caused by bacteria. It belongs to a group of medications called cephalosporin antibiotics. It will not treat colds, the flu, or infections caused by viruses. This medicine may be used for other purposes; ask your health care provider or pharmacist if you have questions. COMMON BRAND NAME(S): Ceftri-IM, Ceftrisol Plus, Rocephin What should I tell my care team before I take this medication? They need to know if you have any of these conditions: Bleeding disorder High bilirubin level in newborn patients Kidney disease Liver disease Poor nutrition An unusual or allergic reaction to ceftriaxone, other penicillin or cephalosporin antibiotics, other medications, foods, dyes, or preservatives Pregnant or trying to get pregnant Breast-feeding How should I use this medication? This medication is injected into a vein or a muscle. It is usually given by your care team in a hospital or clinic setting. It may also be given at home. If you get this medication at home, you will be taught how to prepare and give it. Use exactly as directed. Take it as directed on the prescription label at the same time every day. Keep taking it even if you think you are better. It is important that you put your used needles and syringes in a special sharps container. Do not put them in a trash can. If you do not have a sharps container, call your pharmacist or care team to get one. Talk to your care team about the use of this medication in children. While it may be prescribed for children as young as newborns for selected conditions, precautions do apply. Overdosage: If you think you have taken too much of this medicine contact a poison control center or emergency room at once. NOTE: This medicine is only for you. Do not share this medicine with others. What if I miss a dose? If you get this medication at the hospital or clinic: It is  important not to miss your dose. Call your care team if you are unable to keep an appointment. If you give yourself this medication at home: If you miss a dose, take it as soon as you can. Then continue your normal schedule. If it is almost time for your next dose, take only that dose. Do not take double or extra doses. Call your care team with questions. What may interact with this medication? Estrogen or progestin hormones Intravenous calcium This list may not describe all possible interactions. Give your health care provider a list of all the medicines, herbs, non-prescription drugs, or dietary supplements you use. Also tell them if you smoke, drink alcohol, or use illegal drugs. Some items may interact with your medicine. What should I watch for while using this medication? Tell your care team if your symptoms do not start to get better or if they get worse. Do not treat diarrhea with over the counter products. Contact your care team if you have diarrhea that lasts more than 2 days or if it is severe and watery. If you have diabetes, you may get a false-positive result for sugar in your urine. Check with your care team. If you are being treated for a sexually transmitted infection (STI), avoid sexual contact until you have finished your treatment. Your partner may also need treatment. What side effects may I notice from receiving this medication? Side effects that you should report to your care team as soon as possible: Allergic reactions--skin rash, itching, hives, swelling of the face, lips, tongue, or  throat Hemolytic anemia--unusual weakness or fatigue, dizziness, headache, trouble breathing, dark urine, yellowing skin or eyes Severe diarrhea, fever Unusual vaginal discharge, itching, or odor Side effects that usually do not require medical attention (report to your care team if they continue or are bothersome): Diarrhea Headache Nausea Pain, redness, or irritation at injection  site This list may not describe all possible side effects. Call your doctor for medical advice about side effects. You may report side effects to FDA at 1-800-FDA-1088. Where should I keep my medication? Keep out of the reach of children and pets. You will be instructed on how to store this medication. Get rid of any unused medication after the expiration date. To get rid of medications that are no longer needed or have expired: Take the medication to a medication take-back program. Check with your pharmacy or law enforcement to find a location. If you cannot return the medication, ask your pharmacist or care team how to get rid of this medication safely. NOTE: This sheet is a summary. It may not cover all possible information. If you have questions about this medicine, talk to your doctor, pharmacist, or health care provider.  2024 Elsevier/Gold Standard (2021-12-05 00:00:00)

## 2023-10-18 ENCOUNTER — Other Ambulatory Visit (HOSPITAL_BASED_OUTPATIENT_CLINIC_OR_DEPARTMENT_OTHER): Payer: Self-pay | Admitting: Internal Medicine

## 2023-10-18 ENCOUNTER — Inpatient Hospital Stay: Payer: BC Managed Care – PPO

## 2023-10-18 VITALS — BP 105/71 | HR 98 | Temp 98.1°F | Resp 18

## 2023-10-18 DIAGNOSIS — R110 Nausea: Secondary | ICD-10-CM | POA: Diagnosis not present

## 2023-10-18 DIAGNOSIS — Z8572 Personal history of non-Hodgkin lymphomas: Secondary | ICD-10-CM | POA: Diagnosis not present

## 2023-10-18 DIAGNOSIS — R509 Fever, unspecified: Secondary | ICD-10-CM | POA: Diagnosis not present

## 2023-10-18 DIAGNOSIS — R053 Chronic cough: Secondary | ICD-10-CM | POA: Diagnosis not present

## 2023-10-18 DIAGNOSIS — J189 Pneumonia, unspecified organism: Secondary | ICD-10-CM

## 2023-10-18 DIAGNOSIS — Z9221 Personal history of antineoplastic chemotherapy: Secondary | ICD-10-CM | POA: Diagnosis not present

## 2023-10-18 DIAGNOSIS — H6692 Otitis media, unspecified, left ear: Secondary | ICD-10-CM | POA: Diagnosis not present

## 2023-10-18 MED ORDER — SODIUM CHLORIDE 0.9 % IV SOLN: Freq: Once | INTRAVENOUS | Status: AC

## 2023-10-18 MED ORDER — CEFTRIAXONE SODIUM 2 G IJ SOLR
2.0000 g | INTRAMUSCULAR | Status: DC
  Administered 2023-10-18: 2 g via INTRAVENOUS
  Filled 2023-10-18: qty 20

## 2023-10-18 MED FILL — Ceftriaxone Sodium For Inj 2 GM: INTRAMUSCULAR | Qty: 20 | Status: AC

## 2023-10-18 NOTE — Patient Instructions (Signed)
Ceftriaxone Injection What is this medication? CEFTRIAXONE (sef try AX one) treats infections caused by bacteria. It belongs to a group of medications called cephalosporin antibiotics. It will not treat colds, the flu, or infections caused by viruses. This medicine may be used for other purposes; ask your health care provider or pharmacist if you have questions. COMMON BRAND NAME(S): Ceftri-IM, Ceftrisol Plus, Rocephin What should I tell my care team before I take this medication? They need to know if you have any of these conditions: Bleeding disorder High bilirubin level in newborn patients Kidney disease Liver disease Poor nutrition An unusual or allergic reaction to ceftriaxone, other penicillin or cephalosporin antibiotics, other medications, foods, dyes, or preservatives Pregnant or trying to get pregnant Breast-feeding How should I use this medication? This medication is injected into a vein or a muscle. It is usually given by your care team in a hospital or clinic setting. It may also be given at home. If you get this medication at home, you will be taught how to prepare and give it. Use exactly as directed. Take it as directed on the prescription label at the same time every day. Keep taking it even if you think you are better. It is important that you put your used needles and syringes in a special sharps container. Do not put them in a trash can. If you do not have a sharps container, call your pharmacist or care team to get one. Talk to your care team about the use of this medication in children. While it may be prescribed for children as young as newborns for selected conditions, precautions do apply. Overdosage: If you think you have taken too much of this medicine contact a poison control center or emergency room at once. NOTE: This medicine is only for you. Do not share this medicine with others. What if I miss a dose? If you get this medication at the hospital or clinic: It is  important not to miss your dose. Call your care team if you are unable to keep an appointment. If you give yourself this medication at home: If you miss a dose, take it as soon as you can. Then continue your normal schedule. If it is almost time for your next dose, take only that dose. Do not take double or extra doses. Call your care team with questions. What may interact with this medication? Estrogen or progestin hormones Intravenous calcium This list may not describe all possible interactions. Give your health care provider a list of all the medicines, herbs, non-prescription drugs, or dietary supplements you use. Also tell them if you smoke, drink alcohol, or use illegal drugs. Some items may interact with your medicine. What should I watch for while using this medication? Tell your care team if your symptoms do not start to get better or if they get worse. Do not treat diarrhea with over the counter products. Contact your care team if you have diarrhea that lasts more than 2 days or if it is severe and watery. If you have diabetes, you may get a false-positive result for sugar in your urine. Check with your care team. If you are being treated for a sexually transmitted infection (STI), avoid sexual contact until you have finished your treatment. Your partner may also need treatment. What side effects may I notice from receiving this medication? Side effects that you should report to your care team as soon as possible: Allergic reactions--skin rash, itching, hives, swelling of the face, lips, tongue, or  throat Hemolytic anemia--unusual weakness or fatigue, dizziness, headache, trouble breathing, dark urine, yellowing skin or eyes Severe diarrhea, fever Unusual vaginal discharge, itching, or odor Side effects that usually do not require medical attention (report to your care team if they continue or are bothersome): Diarrhea Headache Nausea Pain, redness, or irritation at injection  site This list may not describe all possible side effects. Call your doctor for medical advice about side effects. You may report side effects to FDA at 1-800-FDA-1088. Where should I keep my medication? Keep out of the reach of children and pets. You will be instructed on how to store this medication. Get rid of any unused medication after the expiration date. To get rid of medications that are no longer needed or have expired: Take the medication to a medication take-back program. Check with your pharmacy or law enforcement to find a location. If you cannot return the medication, ask your pharmacist or care team how to get rid of this medication safely. NOTE: This sheet is a summary. It may not cover all possible information. If you have questions about this medicine, talk to your doctor, pharmacist, or health care provider.  2024 Elsevier/Gold Standard (2021-12-05 00:00:00)

## 2023-10-18 NOTE — Progress Notes (Signed)
 Patient stated she has not been able to provide sputum sample.  Patient still has cup and will bring it in if she is able to collect a sample.

## 2023-10-19 ENCOUNTER — Inpatient Hospital Stay (HOSPITAL_BASED_OUTPATIENT_CLINIC_OR_DEPARTMENT_OTHER): Payer: BC Managed Care – PPO | Admitting: Nurse Practitioner

## 2023-10-19 ENCOUNTER — Other Ambulatory Visit: Payer: Self-pay | Admitting: Nurse Practitioner

## 2023-10-19 ENCOUNTER — Other Ambulatory Visit: Payer: Self-pay | Admitting: *Deleted

## 2023-10-19 ENCOUNTER — Inpatient Hospital Stay: Payer: BC Managed Care – PPO

## 2023-10-19 ENCOUNTER — Encounter: Payer: Self-pay | Admitting: Nurse Practitioner

## 2023-10-19 VITALS — BP 105/71 | HR 89 | Temp 98.4°F | Resp 18

## 2023-10-19 VITALS — BP 106/74 | HR 92 | Temp 98.1°F | Resp 18 | Wt 135.3 lb

## 2023-10-19 DIAGNOSIS — J189 Pneumonia, unspecified organism: Secondary | ICD-10-CM | POA: Diagnosis not present

## 2023-10-19 DIAGNOSIS — C8299 Follicular lymphoma, unspecified, extranodal and solid organ sites: Secondary | ICD-10-CM | POA: Diagnosis not present

## 2023-10-19 DIAGNOSIS — Z9221 Personal history of antineoplastic chemotherapy: Secondary | ICD-10-CM | POA: Diagnosis not present

## 2023-10-19 DIAGNOSIS — H6692 Otitis media, unspecified, left ear: Secondary | ICD-10-CM | POA: Diagnosis not present

## 2023-10-19 DIAGNOSIS — Z8572 Personal history of non-Hodgkin lymphomas: Secondary | ICD-10-CM | POA: Diagnosis not present

## 2023-10-19 DIAGNOSIS — R509 Fever, unspecified: Secondary | ICD-10-CM | POA: Diagnosis not present

## 2023-10-19 DIAGNOSIS — R053 Chronic cough: Secondary | ICD-10-CM | POA: Diagnosis not present

## 2023-10-19 DIAGNOSIS — R110 Nausea: Secondary | ICD-10-CM | POA: Diagnosis not present

## 2023-10-19 LAB — EXPECTORATED SPUTUM ASSESSMENT W GRAM STAIN, RFLX TO RESP C

## 2023-10-19 MED ORDER — CEFTRIAXONE SODIUM 2 G IJ SOLR
2.0000 g | INTRAMUSCULAR | Status: DC
  Administered 2023-10-19: 2 g via INTRAVENOUS
  Filled 2023-10-19: qty 2

## 2023-10-19 MED ORDER — SODIUM CHLORIDE 0.9 % IV SOLN: INTRAVENOUS | Status: DC

## 2023-10-19 NOTE — Patient Instructions (Signed)
Ceftriaxone Injection What is this medication? CEFTRIAXONE (sef try AX one) treats infections caused by bacteria. It belongs to a group of medications called cephalosporin antibiotics. It will not treat colds, the flu, or infections caused by viruses. This medicine may be used for other purposes; ask your health care provider or pharmacist if you have questions. COMMON BRAND NAME(S): Ceftri-IM, Ceftrisol Plus, Rocephin What should I tell my care team before I take this medication? They need to know if you have any of these conditions: Bleeding disorder High bilirubin level in newborn patients Kidney disease Liver disease Poor nutrition An unusual or allergic reaction to ceftriaxone, other penicillin or cephalosporin antibiotics, other medications, foods, dyes, or preservatives Pregnant or trying to get pregnant Breast-feeding How should I use this medication? This medication is injected into a vein or a muscle. It is usually given by your care team in a hospital or clinic setting. It may also be given at home. If you get this medication at home, you will be taught how to prepare and give it. Use exactly as directed. Take it as directed on the prescription label at the same time every day. Keep taking it even if you think you are better. It is important that you put your used needles and syringes in a special sharps container. Do not put them in a trash can. If you do not have a sharps container, call your pharmacist or care team to get one. Talk to your care team about the use of this medication in children. While it may be prescribed for children as young as newborns for selected conditions, precautions do apply. Overdosage: If you think you have taken too much of this medicine contact a poison control center or emergency room at once. NOTE: This medicine is only for you. Do not share this medicine with others. What if I miss a dose? If you get this medication at the hospital or clinic: It is  important not to miss your dose. Call your care team if you are unable to keep an appointment. If you give yourself this medication at home: If you miss a dose, take it as soon as you can. Then continue your normal schedule. If it is almost time for your next dose, take only that dose. Do not take double or extra doses. Call your care team with questions. What may interact with this medication? Estrogen or progestin hormones Intravenous calcium This list may not describe all possible interactions. Give your health care provider a list of all the medicines, herbs, non-prescription drugs, or dietary supplements you use. Also tell them if you smoke, drink alcohol, or use illegal drugs. Some items may interact with your medicine. What should I watch for while using this medication? Tell your care team if your symptoms do not start to get better or if they get worse. Do not treat diarrhea with over the counter products. Contact your care team if you have diarrhea that lasts more than 2 days or if it is severe and watery. If you have diabetes, you may get a false-positive result for sugar in your urine. Check with your care team. If you are being treated for a sexually transmitted infection (STI), avoid sexual contact until you have finished your treatment. Your partner may also need treatment. What side effects may I notice from receiving this medication? Side effects that you should report to your care team as soon as possible: Allergic reactions--skin rash, itching, hives, swelling of the face, lips, tongue, or  throat Hemolytic anemia--unusual weakness or fatigue, dizziness, headache, trouble breathing, dark urine, yellowing skin or eyes Severe diarrhea, fever Unusual vaginal discharge, itching, or odor Side effects that usually do not require medical attention (report to your care team if they continue or are bothersome): Diarrhea Headache Nausea Pain, redness, or irritation at injection  site This list may not describe all possible side effects. Call your doctor for medical advice about side effects. You may report side effects to FDA at 1-800-FDA-1088. Where should I keep my medication? Keep out of the reach of children and pets. You will be instructed on how to store this medication. Get rid of any unused medication after the expiration date. To get rid of medications that are no longer needed or have expired: Take the medication to a medication take-back program. Check with your pharmacy or law enforcement to find a location. If you cannot return the medication, ask your pharmacist or care team how to get rid of this medication safely. NOTE: This sheet is a summary. It may not cover all possible information. If you have questions about this medicine, talk to your doctor, pharmacist, or health care provider.  2024 Elsevier/Gold Standard (2021-12-05 00:00:00)

## 2023-10-19 NOTE — Progress Notes (Signed)
 [facility] OFFICE PROGRESS NOTE   Diagnosis: Non-Hodgkin's lymphoma  INTERVAL HISTORY:   Adrienne Morgan returns as scheduled.  She began a course of ceftriaxone  10/16/2023.  She reports a persistent cough.  She had a coughing fit lasting about 20 minutes earlier this morning.  Cough at that time was dry.  This morning she has a deep cough which she feels is looser.  She continues to have low-grade fever 99.2-99.5 at nighttime.  She had a temperature of 101.4 three days ago.  The feeling of dyspnea is somewhat better.  She has mild nausea which she attributes to the frequent cough.  No diarrhea.  No rash.  Objective:  Vital signs in last 24 hours:  Blood pressure 106/74, pulse 92, temperature 98.1 F (36.7 C), temperature source Temporal, resp. rate 18, weight 135 lb 4.8 oz (61.4 kg), SpO2 99%.    HEENT: No thrush or ulcers. Lymphatics: Firm fullness lower neck/supraclavicular fossa left greater than right.  No discrete adenopathy. Resp: Rales at bilateral lung bases right greater than left.  No respiratory distress. Cardio: Regular rate and rhythm. GI: No hepatosplenomegaly. Vascular: No leg edema.    Lab Results:  Lab Results  Component Value Date   WBC 6.4 10/16/2023   HGB 11.7 (L) 10/16/2023   HCT 35.7 (L) 10/16/2023   MCV 91.1 10/16/2023   PLT 391 10/16/2023   NEUTROABS 5.2 10/16/2023    Imaging:  No results found.  Medications: I have reviewed the patient's current medications.  Assessment/Plan: Follicular lymphoma-low-grade, clinical stage III, FLIPI-intermediate risk Bilateral axillary ultrasound 08/29/2023-mildly prominent lymph nodes, right axillary node without a defined preserved fatty hilum Ultrasound-guided biopsy of right axillary lymph node 12/19/2022-follicular lymphoma, overall grade 1 of 3, focal grade 2, Ki-67 less than 10% with areas of under 5% and areas at 15-20%, CD20, BCL6, CD10 positive.  Bcl-2 positive.  Flow cytometry-lambda  restricted CD10 positive B-cell lymphoma PET 01/04/2023-mild to moderate FDG activity associated with borderline enlarged bilateral axillary, retroperitoneal, mesenteric, right inguinal, and right external iliac nodes, mild diffuse increased uptake in the spleen Cycle 1 Bendamustine /rituximab  01/24/2023 Cycle 2 Bendamustine /rituximab  02/21/2023, day 2 Bendamustine  held secondary to fever, arthralgias, and rash Cycle 3 Bendamustine /rituximab  03/20/2023 Cycle 4 Bendamustine /rituximab  04/18/2023 PET 05/11/2023-resolution of hypermetabolic lymph nodes in the neck, chest, abdomen, and pelvis, Deauville 1, resolution of splenic hypermetabolism, no evidence of residual metabolically active lymphoma. Cycle 5 Bendamustine /rituximab  05/16/2023 Cycle 6 Bendamustine /rituximab  06/13/2023 PCOS multiple miscarriages History of a colon polyp-tubular adenoma Family history of colon and esophagus cancer, and sister with non-Hodgkin's lymphoma Upper respiratory infection beginning September 2024, multiple courses of Levaquin, treatment with prednisone, course of cefdinir, report of x-rays via primary provider's office showing lower lung infiltrates CT chest 10/16/2023: Dense consolidation in the bilateral lung base, no lymphadenopathy Course of ceftriaxone  beginning 10/16/2023 6.  Left ear infection-status post left myringotomy with tube placement 10/01/2023  Disposition: Adrienne Morgan appears stable.  Respiratory symptoms may be mildly improved.  She has a persistent cough and low-grade fever at nighttime.  She will receive another dose of ceftriaxone  tomorrow.  Referral placed to pulmonary at [facility] for assistance with management.  She will return for follow-up and possible ceftriaxone  10/22/2023.  She understands to proceed to the emergency department if symptoms worsen over the weekend.  Patient seen with Dr. Cloretta.    Olam Ned ANP/GNP-BC   10/19/2023  10:15 AM  This was a shared visit with Olam Ned.  Adrienne Morgan  was interviewed and examined.  She has completed 3 days of ceftriaxone  therapy.  She continues to have a cough and low-grade fever.  She will complete 2 more days of ceftriaxone .  We will refer her to the pulmonary medicine service for further evaluation.  A sputum culture is pending.  I was present for greater than 50% of today's visit.  I performed Medical Decision Making.  Arvella Hof, MD

## 2023-10-19 NOTE — Progress Notes (Signed)
 Rocephin for 10/20/23: Lot:[id number], Exp:07/2024 x 1 vial (1 g) and ZOX:[id number], Exp:04/2025 x 1 vial (1 g)  Anola Gurney Waggaman, RPH, BCPS, BCOP 10/19/2023 12:48 PM

## 2023-10-20 ENCOUNTER — Inpatient Hospital Stay: Payer: BC Managed Care – PPO

## 2023-10-20 VITALS — BP 114/78 | HR 91 | Temp 98.2°F | Resp 16

## 2023-10-20 DIAGNOSIS — R053 Chronic cough: Secondary | ICD-10-CM | POA: Diagnosis not present

## 2023-10-20 DIAGNOSIS — H6692 Otitis media, unspecified, left ear: Secondary | ICD-10-CM | POA: Diagnosis not present

## 2023-10-20 DIAGNOSIS — R509 Fever, unspecified: Secondary | ICD-10-CM | POA: Diagnosis not present

## 2023-10-20 DIAGNOSIS — Z9221 Personal history of antineoplastic chemotherapy: Secondary | ICD-10-CM | POA: Diagnosis not present

## 2023-10-20 DIAGNOSIS — Z8572 Personal history of non-Hodgkin lymphomas: Secondary | ICD-10-CM | POA: Diagnosis not present

## 2023-10-20 DIAGNOSIS — J189 Pneumonia, unspecified organism: Secondary | ICD-10-CM

## 2023-10-20 DIAGNOSIS — R110 Nausea: Secondary | ICD-10-CM | POA: Diagnosis not present

## 2023-10-20 MED ORDER — SODIUM CHLORIDE 0.9 % IV SOLN
INTRAVENOUS | Status: DC
Start: 2023-10-20 — End: 2023-10-20

## 2023-10-20 MED ORDER — CEFTRIAXONE SODIUM 2 G IJ SOLR
2.0000 g | INTRAMUSCULAR | Status: DC
  Administered 2023-10-20: 2 g via INTRAVENOUS
  Filled 2023-10-20: qty 20

## 2023-10-20 NOTE — Patient Instructions (Signed)
Ceftriaxone Injection What is this medication? CEFTRIAXONE (sef try AX one) treats infections caused by bacteria. It belongs to a group of medications called cephalosporin antibiotics. It will not treat colds, the flu, or infections caused by viruses. This medicine may be used for other purposes; ask your health care provider or pharmacist if you have questions. COMMON BRAND NAME(S): Ceftri-IM, Ceftrisol Plus, Rocephin What should I tell my care team before I take this medication? They need to know if you have any of these conditions: Bleeding disorder High bilirubin level in newborn patients Kidney disease Liver disease Poor nutrition An unusual or allergic reaction to ceftriaxone, other penicillin or cephalosporin antibiotics, other medications, foods, dyes, or preservatives Pregnant or trying to get pregnant Breast-feeding How should I use this medication? This medication is injected into a vein or a muscle. It is usually given by your care team in a hospital or clinic setting. It may also be given at home. If you get this medication at home, you will be taught how to prepare and give it. Use exactly as directed. Take it as directed on the prescription label at the same time every day. Keep taking it even if you think you are better. It is important that you put your used needles and syringes in a special sharps container. Do not put them in a trash can. If you do not have a sharps container, call your pharmacist or care team to get one. Talk to your care team about the use of this medication in children. While it may be prescribed for children as young as newborns for selected conditions, precautions do apply. Overdosage: If you think you have taken too much of this medicine contact a poison control center or emergency room at once. NOTE: This medicine is only for you. Do not share this medicine with others. What if I miss a dose? If you get this medication at the hospital or clinic: It is  important not to miss your dose. Call your care team if you are unable to keep an appointment. If you give yourself this medication at home: If you miss a dose, take it as soon as you can. Then continue your normal schedule. If it is almost time for your next dose, take only that dose. Do not take double or extra doses. Call your care team with questions. What may interact with this medication? Estrogen or progestin hormones Intravenous calcium This list may not describe all possible interactions. Give your health care provider a list of all the medicines, herbs, non-prescription drugs, or dietary supplements you use. Also tell them if you smoke, drink alcohol, or use illegal drugs. Some items may interact with your medicine. What should I watch for while using this medication? Tell your care team if your symptoms do not start to get better or if they get worse. Do not treat diarrhea with over the counter products. Contact your care team if you have diarrhea that lasts more than 2 days or if it is severe and watery. If you have diabetes, you may get a false-positive result for sugar in your urine. Check with your care team. If you are being treated for a sexually transmitted infection (STI), avoid sexual contact until you have finished your treatment. Your partner may also need treatment. What side effects may I notice from receiving this medication? Side effects that you should report to your care team as soon as possible: Allergic reactions--skin rash, itching, hives, swelling of the face, lips, tongue, or  throat Hemolytic anemia--unusual weakness or fatigue, dizziness, headache, trouble breathing, dark urine, yellowing skin or eyes Severe diarrhea, fever Unusual vaginal discharge, itching, or odor Side effects that usually do not require medical attention (report to your care team if they continue or are bothersome): Diarrhea Headache Nausea Pain, redness, or irritation at injection  site This list may not describe all possible side effects. Call your doctor for medical advice about side effects. You may report side effects to FDA at 1-800-FDA-1088. Where should I keep my medication? Keep out of the reach of children and pets. You will be instructed on how to store this medication. Get rid of any unused medication after the expiration date. To get rid of medications that are no longer needed or have expired: Take the medication to a medication take-back program. Check with your pharmacy or law enforcement to find a location. If you cannot return the medication, ask your pharmacist or care team how to get rid of this medication safely. NOTE: This sheet is a summary. It may not cover all possible information. If you have questions about this medicine, talk to your doctor, pharmacist, or health care provider.  2024 Elsevier/Gold Standard (2021-12-05 00:00:00)

## 2023-10-21 LAB — CULTURE, RESPIRATORY W GRAM STAIN

## 2023-10-22 ENCOUNTER — Encounter: Payer: Self-pay | Admitting: Nurse Practitioner

## 2023-10-22 ENCOUNTER — Inpatient Hospital Stay: Payer: BC Managed Care – PPO | Admitting: Nurse Practitioner

## 2023-10-22 ENCOUNTER — Encounter (HOSPITAL_BASED_OUTPATIENT_CLINIC_OR_DEPARTMENT_OTHER): Payer: Self-pay | Admitting: Pulmonary Disease

## 2023-10-22 ENCOUNTER — Inpatient Hospital Stay: Payer: BC Managed Care – PPO

## 2023-10-22 ENCOUNTER — Ambulatory Visit (HOSPITAL_BASED_OUTPATIENT_CLINIC_OR_DEPARTMENT_OTHER): Payer: BC Managed Care – PPO | Admitting: Pulmonary Disease

## 2023-10-22 ENCOUNTER — Ambulatory Visit (HOSPITAL_BASED_OUTPATIENT_CLINIC_OR_DEPARTMENT_OTHER): Payer: BC Managed Care – PPO

## 2023-10-22 ENCOUNTER — Ambulatory Visit: Payer: BC Managed Care – PPO

## 2023-10-22 VITALS — BP 124/69 | HR 101 | Ht 63.0 in | Wt 135.0 lb

## 2023-10-22 VITALS — BP 124/69 | HR 101 | Temp 98.6°F | Resp 18

## 2023-10-22 DIAGNOSIS — J189 Pneumonia, unspecified organism: Secondary | ICD-10-CM | POA: Diagnosis not present

## 2023-10-22 DIAGNOSIS — Z9221 Personal history of antineoplastic chemotherapy: Secondary | ICD-10-CM | POA: Diagnosis not present

## 2023-10-22 DIAGNOSIS — H6692 Otitis media, unspecified, left ear: Secondary | ICD-10-CM | POA: Diagnosis not present

## 2023-10-22 DIAGNOSIS — Z8572 Personal history of non-Hodgkin lymphomas: Secondary | ICD-10-CM | POA: Diagnosis not present

## 2023-10-22 DIAGNOSIS — R059 Cough, unspecified: Secondary | ICD-10-CM | POA: Diagnosis not present

## 2023-10-22 DIAGNOSIS — R110 Nausea: Secondary | ICD-10-CM | POA: Diagnosis not present

## 2023-10-22 DIAGNOSIS — R509 Fever, unspecified: Secondary | ICD-10-CM | POA: Diagnosis not present

## 2023-10-22 DIAGNOSIS — R053 Chronic cough: Secondary | ICD-10-CM | POA: Diagnosis not present

## 2023-10-22 MED ORDER — CEFTRIAXONE SODIUM 2 G IJ SOLR
2.0000 g | INTRAMUSCULAR | Status: DC
  Administered 2023-10-22: 2 g via INTRAVENOUS
  Filled 2023-10-22: qty 2

## 2023-10-22 MED ORDER — SODIUM CHLORIDE 0.9 % IV SOLN
Freq: Once | INTRAVENOUS | Status: AC
Start: 2023-10-22 — End: 2023-10-22

## 2023-10-22 NOTE — Patient Instructions (Signed)
 X CXR today  , repeat in 4 weeks

## 2023-10-22 NOTE — Patient Instructions (Signed)
Ceftriaxone Injection What is this medication? CEFTRIAXONE (sef try AX one) treats infections caused by bacteria. It belongs to a group of medications called cephalosporin antibiotics. It will not treat colds, the flu, or infections caused by viruses. This medicine may be used for other purposes; ask your health care provider or pharmacist if you have questions. COMMON BRAND NAME(S): Ceftri-IM, Ceftrisol Plus, Rocephin What should I tell my care team before I take this medication? They need to know if you have any of these conditions: Bleeding disorder High bilirubin level in newborn patients Kidney disease Liver disease Poor nutrition An unusual or allergic reaction to ceftriaxone, other penicillin or cephalosporin antibiotics, other medications, foods, dyes, or preservatives Pregnant or trying to get pregnant Breast-feeding How should I use this medication? This medication is injected into a vein or a muscle. It is usually given by your care team in a hospital or clinic setting. It may also be given at home. If you get this medication at home, you will be taught how to prepare and give it. Use exactly as directed. Take it as directed on the prescription label at the same time every day. Keep taking it even if you think you are better. It is important that you put your used needles and syringes in a special sharps container. Do not put them in a trash can. If you do not have a sharps container, call your pharmacist or care team to get one. Talk to your care team about the use of this medication in children. While it may be prescribed for children as young as newborns for selected conditions, precautions do apply. Overdosage: If you think you have taken too much of this medicine contact a poison control center or emergency room at once. NOTE: This medicine is only for you. Do not share this medicine with others. What if I miss a dose? If you get this medication at the hospital or clinic: It is  important not to miss your dose. Call your care team if you are unable to keep an appointment. If you give yourself this medication at home: If you miss a dose, take it as soon as you can. Then continue your normal schedule. If it is almost time for your next dose, take only that dose. Do not take double or extra doses. Call your care team with questions. What may interact with this medication? Estrogen or progestin hormones Intravenous calcium This list may not describe all possible interactions. Give your health care provider a list of all the medicines, herbs, non-prescription drugs, or dietary supplements you use. Also tell them if you smoke, drink alcohol, or use illegal drugs. Some items may interact with your medicine. What should I watch for while using this medication? Tell your care team if your symptoms do not start to get better or if they get worse. Do not treat diarrhea with over the counter products. Contact your care team if you have diarrhea that lasts more than 2 days or if it is severe and watery. If you have diabetes, you may get a false-positive result for sugar in your urine. Check with your care team. If you are being treated for a sexually transmitted infection (STI), avoid sexual contact until you have finished your treatment. Your partner may also need treatment. What side effects may I notice from receiving this medication? Side effects that you should report to your care team as soon as possible: Allergic reactions--skin rash, itching, hives, swelling of the face, lips, tongue, or  throat Hemolytic anemia--unusual weakness or fatigue, dizziness, headache, trouble breathing, dark urine, yellowing skin or eyes Severe diarrhea, fever Unusual vaginal discharge, itching, or odor Side effects that usually do not require medical attention (report to your care team if they continue or are bothersome): Diarrhea Headache Nausea Pain, redness, or irritation at injection  site This list may not describe all possible side effects. Call your doctor for medical advice about side effects. You may report side effects to FDA at 1-800-FDA-1088. Where should I keep my medication? Keep out of the reach of children and pets. You will be instructed on how to store this medication. Get rid of any unused medication after the expiration date. To get rid of medications that are no longer needed or have expired: Take the medication to a medication take-back program. Check with your pharmacy or law enforcement to find a location. If you cannot return the medication, ask your pharmacist or care team how to get rid of this medication safely. NOTE: This sheet is a summary. It may not cover all possible information. If you have questions about this medicine, talk to your doctor, pharmacist, or health care provider.  2024 Elsevier/Gold Standard (2021-12-05 00:00:00)

## 2023-10-22 NOTE — Progress Notes (Signed)
 Subjective:    Patient ID: Adrienne Morgan, female    DOB: 1975/12/20, 48 y.o.   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 She has a history of low-grade follicular lymphoma and is undergoing monthly Bendamustine  rituximab  treatment, last dose was 06/13/23.  She developed URI symptoms in September 2024 and since then has undergone multiple courses of Levaquin with a course of prednisone  Per dr Bernell description , 'She developed sinus congestion beginning in September.  She experienced initial improvement after 10 days of Levaquin.  Her symptoms progressed in October as she completed another 7-day course of Levaquin beginning 07/30/2023.  She had a persistent cough and completed 7 days of prednisone beginning 08/27/2023.  The cough did not improve with prednisone.   She began cefdinir for 10 days 09/02/2023.  Her symptoms persisted.  She reports a chest x-ray via Dr. Zadie office on 09/11/2023 revealed bilateral lower lung infiltrates.  She completed 10 more days of Levaquin with some improvement.  A repeat chest x-ray 09/25/2023 revealed improvement in the left lung infiltrate but a persistent right lower lung infiltrate.'  She had fevers 101.5 last week with cough, night sweats and exertional dyspnea   CT chest 10/16/2023 showed dense consolidation in both lung bases.  Dr. Cloretta discussed with me and started her on daily ceftriaxone  beginning 1/7  x 6 doses ,last today   Sputum Gram stain showed gram-negative rods, no staph or Pseudomonas isolated.  Labs showed a BC count 6.2 81% segs, hemoglobin 11.7, platelets recovered  Discussed the use of AI scribe software for clinical note transcription with the patient, who gave verbal consent to proceed.  History of Present Illness   The patient, with a history of non-Hodgkin's lymphoma, presents with persistent cough, fever, and night sweats. The symptoms started in mid-September,  following the completion of chemotherapy treatment for lymphoma in early September. The patient initially had a sinus infection, which progressed to a persistent cough. Despite five rounds of Levaquin and one round of cefdinir, the cough persisted. In early December, the patient was diagnosed with pneumonia. Despite additional treatment with Levaquin, the patient did not improve and reported feeling unwell. The patient also reported experiencing night sweats, similar to those experienced during lymphoma treatment. The patient has been on antibiotics for an extended period and has recently completed a six-day course of ceftriaxone . The patient reports feeling slightly better, with reduced fever and less severe coughing fits. However, the patient continues to produce yellow sputum and experiences occasional shortness of breath.       Past Medical History:  Diagnosis Date   Anxiety    No meds   Chronic kidney disease 2014   kidney stones   History of kidney stones    passed stone - no surgery required   PCOS (polycystic ovarian syndrome)    Pneumonia    PONV (postoperative nausea and vomiting)    SVD (spontaneous vaginal delivery)    x 1     Past Surgical History:  Procedure Laterality Date   cervical disk      C5-C6 replacement   DILATION AND CURETTAGE OF UTERUS  2011   missed   DILATION AND EVACUATION  09/27/2011   Procedure: DILATATION AND EVACUATION;  Surgeon: Rosaline LITTIE Cobble, MD;  Location: [facility];  Service: Gynecology;  Laterality: N/A;  with Ultrasound Guidance and Chromosomal Studies   DILATION AND EVACUATION N/A 12/13/2017   Procedure: DILATATION AND EVACUATION with  ultrasound guidance;  Surgeon: Mat Browning, MD;  Location: [facility];  Service: Gynecology;  Laterality: N/A;   ROTATOR CUFF REPAIR Left    TONSILLECTOMY AND ADENOIDECTOMY      Allergies  Allergen Reactions   Augmentin [Amoxicillin -Pot Clavulanate] Hives and Other (See Comments)    Pt tolerates plain  amoxicillin /penicillin with no problems    Social History   Socioeconomic History   Marital status: Married    Spouse name: Not on file   Number of children: 1   Years of education: Not on file   Highest education level: Not on file  Occupational History   Occupation: RN  Tobacco Use   Smoking status: Former    Current packs/day: 0.00    Types: Cigarettes    Start date: 1997    Quit date: 2000    Years since quitting: 25.0   Smokeless tobacco: Never   Tobacco comments:    college years  Vaping Use   Vaping status: Never Used  Substance and Sexual Activity   Alcohol  use: Not Currently    Comment: 1-3 per week   Drug use: No   Sexual activity: Yes    Comment: approx 11-[redacted] wks gestation  Other Topics Concern   Not on file  Social History Narrative   Not on file   Social Drivers of Health   Financial Resource Strain: Low Risk  (01/01/2023)   Overall Financial Resource Strain (CARDIA)    Difficulty of Paying Living Expenses: Not hard at all  Food Insecurity: No Food Insecurity (01/01/2023)   Hunger Vital Sign    Worried About Running Out of Food in the Last Year: Never true    Ran Out of Food in the Last Year: Never true  Transportation Needs: Unknown (01/01/2023)   PRAPARE - Administrator, Civil Service (Medical): No    Lack of Transportation (Non-Medical): Not on file  Physical Activity: Sufficiently Active (01/01/2023)   Exercise Vital Sign    Days of Exercise per Week: 5 days    Minutes of Exercise per Session: 30 min  Stress: No Stress Concern Present (01/01/2023)   [employer] of Occupational Health - Occupational Stress Questionnaire    Feeling of Stress : Only a little  Social Connections: Moderately Integrated (01/01/2023)   Social Connection and Isolation Panel [NHANES]    Frequency of Communication with Friends and Family: Three times a week    Frequency of Social Gatherings with Friends and Family: Three times a week    Attends Religious  Services: More than 4 times per year    Active Member of Clubs or Organizations: No    Attends Banker Meetings: Never    Marital Status: Married  Catering Manager Violence: Not At Risk (01/01/2023)   Humiliation, Afraid, Rape, and Kick questionnaire    Fear of Current or Ex-Partner: No    Emotionally Abused: No    Physically Abused: No    Sexually Abused: No      Family History  Problem Relation Age of Onset   Hypertension Father    Diabetes Father    Colon polyps Father    Cirrhosis Father    COPD Paternal Grandfather    Esophageal cancer Paternal Grandfather    Colon cancer Mother 31   Colon polyps Mother    Irritable bowel syndrome Sister    Rectal cancer Neg Hx    Stomach cancer Neg Hx     Review of Systems Constitutional: negative for anorexia,  fevers and sweats  Eyes: negative for irritation, redness and visual disturbance  Ears, nose, mouth, throat, and face: negative for earaches, epistaxis, nasal congestion and sore throat   Cardiovascular: negative for chest pain, dyspnea, lower extremity edema, orthopnea, palpitations and syncope  Gastrointestinal: negative for abdominal pain, constipation, diarrhea, melena, nausea and vomiting  Genitourinary:negative for dysuria, frequency and hematuria  Hematologic/lymphatic: negative for bleeding, easy bruising and lymphadenopathy  Musculoskeletal:negative for arthralgias, muscle weakness and stiff joints  Neurological: negative for coordination problems, gait problems, headaches and weakness  Endocrine: negative for diabetic symptoms including polydipsia, polyuria and weight loss     Objective:   Physical Exam  Gen. Pleasant, well-nourished, in no distress, normal affect ENT - no pallor,icterus, no post nasal drip Neck: No JVD, no thyromegaly, no carotid bruits Lungs: no use of accessory muscles, no dullness to percussion, clear without rales or rhonchi  Cardiovascular: Rhythm regular, heart sounds   normal, no murmurs or gallops, no peripheral edema Abdomen: soft and non-tender, no hepatosplenomegaly, BS normal. Musculoskeletal: No deformities, no cyanosis or clubbing Neuro:  alert, non focal       Assessment & Plan:    Assessment and Plan    Comm acquired Pneumonia -resistant bug? immunocompromised host Bilateral pneumonia diagnosed in early December with persistent symptoms despite multiple antibiotic courses (Levaquin, cefdinir). Recent improvement noted after six doses of ceftriaxone . Concerns about potential resistant organisms or immunocompromised state due to recent lymphoma treatment. Symptoms include productive cough with yellow sputum, night sweats, and intermittent low-grade fevers. Recent CT scan shows consolidation without masses, ruling out lymphoma recurrence. Discussed possibility of resistant gram-negative organisms and need for bronchoscopy if symptoms relapse. Explained ceftriaxone 's efficacy against gram-negative bacteria and that further antibiotics are not currently needed. Emphasized monitoring for fever and worsening symptoms. - Order chest x-ray today - Monitor symptoms and report any fevers or worsening condition - Consider bronchoscopy if symptoms relapse or x-ray worsens - Advise use of Mucinex during the day to help with productive cough - Follow up in four weeks or sooner if symptoms worsen  Ear Infection with Tympanostomy Tube Tympanostomy tube placed on December 23 due to ear infection. Persistent drainage of yellow mucus noted. Awaiting response from ENT. Discussed that antibiotics for pneumonia should also help with the ear infection. - Await response from ENT regarding persistent drainage  Non-Hodgkin's Lymphoma (Follicular) Completed six months of treatment in early September. Currently in a watchful waiting phase with monitoring every three months. Persistent immunocompromised state due to Rituxan  treatment, contributing to susceptibility to  infections. Discussed long-term immunocompromised state due to Rituxan  and need for continued precautions to avoid infections. - Continue monitoring every three months - Advise masking at work and avoiding large crowds to reduce infection risk  General Health Maintenance Received Prevnar 13 and Pneumovax vaccines due to increased risk of pneumonia. - Continue general health maintenance and vaccinations as needed  Follow-up - Follow up with MyChart message by end of the week to report on symptoms - Schedule follow-up appointment in four weeks or sooner if symptoms worsen.

## 2023-10-23 ENCOUNTER — Other Ambulatory Visit: Payer: Self-pay

## 2023-10-24 ENCOUNTER — Encounter: Payer: Self-pay | Admitting: Nurse Practitioner

## 2023-10-24 ENCOUNTER — Inpatient Hospital Stay (HOSPITAL_BASED_OUTPATIENT_CLINIC_OR_DEPARTMENT_OTHER): Payer: BC Managed Care – PPO | Admitting: Nurse Practitioner

## 2023-10-24 VITALS — BP 109/67 | HR 92 | Temp 98.1°F | Resp 18 | Ht 63.0 in | Wt 134.3 lb

## 2023-10-24 DIAGNOSIS — R110 Nausea: Secondary | ICD-10-CM | POA: Diagnosis not present

## 2023-10-24 DIAGNOSIS — R053 Chronic cough: Secondary | ICD-10-CM | POA: Diagnosis not present

## 2023-10-24 DIAGNOSIS — R509 Fever, unspecified: Secondary | ICD-10-CM | POA: Diagnosis not present

## 2023-10-24 DIAGNOSIS — J189 Pneumonia, unspecified organism: Secondary | ICD-10-CM

## 2023-10-24 DIAGNOSIS — C8299 Follicular lymphoma, unspecified, extranodal and solid organ sites: Secondary | ICD-10-CM | POA: Diagnosis not present

## 2023-10-24 DIAGNOSIS — H6692 Otitis media, unspecified, left ear: Secondary | ICD-10-CM | POA: Diagnosis not present

## 2023-10-24 DIAGNOSIS — Z9221 Personal history of antineoplastic chemotherapy: Secondary | ICD-10-CM | POA: Diagnosis not present

## 2023-10-24 DIAGNOSIS — Z8572 Personal history of non-Hodgkin lymphomas: Secondary | ICD-10-CM | POA: Diagnosis not present

## 2023-10-24 NOTE — Progress Notes (Signed)
 [facility] OFFICE PROGRESS NOTE   Diagnosis: Non-Hodgkin's lymphoma  INTERVAL HISTORY:   Adrienne Morgan returns as scheduled.  She completed a 6th dose of ceftriaxone  10/22/2023.  She reports a persistent cough.  Less dyspnea.  Overall she feels better.  She is utilizing the incentive spirometer.  Energy level has improved.  She is no longer having fevers.  Objective:  Vital signs in last 24 hours:  Blood pressure 109/67, pulse 92, temperature 98.1 F (36.7 C), temperature source Temporal, resp. rate 18, height 5\' 3"  (1.6 m), weight 134 lb 4.8 oz (60.9 kg), SpO2 100%.    HEENT: No thrush or ulcers. Resp: Faint rales both lung bases.  No respiratory distress. Cardio: Regular rate and rhythm. GI: Abdomen soft and nontender.  No hepatosplenomegaly. Vascular: No leg edema.   Lab Results:  Lab Results  Component Value Date   WBC 6.4 10/16/2023   HGB 11.7 (L) 10/16/2023   HCT 35.7 (L) 10/16/2023   MCV 91.1 10/16/2023   PLT 391 10/16/2023   NEUTROABS 5.2 10/16/2023    Imaging:  DG Chest 2 View Result Date: 10/24/2023 CLINICAL DATA:  Cough. EXAM: CHEST - 2 VIEW COMPARISON:  08/20/2023. FINDINGS: Bilateral lower lobe airspace consolidation consistent with multifocal pneumonia. Remainder of the lungs is clear. Heart mediastinum hila are within normal limits. No pleural effusion or pneumothorax. Skeletal structures are unremarkable. IMPRESSION: 1. Bilateral lower lobe pneumonia. Electronically Signed   By: Amanda Jungling M.D.   On: 10/24/2023 11:15    Medications: I have reviewed the patient's current medications.  Assessment/Plan: Follicular lymphoma-low-grade, clinical stage III, FLIPI-intermediate risk Bilateral axillary ultrasound 08/29/2023-mildly prominent lymph nodes, right axillary node without a defined preserved fatty hilum Ultrasound-guided biopsy of right axillary lymph node 12/19/2022-follicular lymphoma, overall grade 1 of 3, focal grade 2, Ki-67 less than  10% with areas of under 5% and areas at 15-20%, CD20, BCL6, CD10 positive.  Bcl-2 positive.  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 PET 01/04/2023-mild to moderate FDG activity associated with borderline enlarged bilateral axillary, retroperitoneal, mesenteric, right inguinal, and right external iliac nodes, mild diffuse increased uptake in the spleen Cycle 1 Bendamustine /rituximab  01/24/2023 Cycle 2 Bendamustine /rituximab  02/21/2023, day 2 Bendamustine  held secondary to fever, arthralgias, and rash Cycle 3 Bendamustine /rituximab  03/20/2023 Cycle 4 Bendamustine /rituximab  04/18/2023 PET 05/11/2023-resolution of hypermetabolic lymph nodes in the neck, chest, abdomen, and pelvis, Deauville 1, resolution of splenic hypermetabolism, no evidence of residual metabolically active lymphoma. Cycle 5 Bendamustine /rituximab  05/16/2023 Cycle 6 Bendamustine /rituximab  06/13/2023 PCOS multiple miscarriages History of a colon polyp-tubular adenoma Family history of colon and esophagus cancer, and sister with non-Hodgkin's lymphoma Upper respiratory infection beginning September 2024, multiple courses of Levaquin, treatment with prednisone, course of cefdinir, report of x-rays via primary provider's office showing lower lung infiltrates CT chest 10/16/2023: Dense consolidation in the bilateral lung base, no lymphadenopathy Course of ceftriaxone  beginning 10/16/2023 x 6 doses 6.  Left ear infection-status post left myringotomy with tube placement 10/01/2023  Disposition: Adrienne Morgan appears stable.  She has completed 6 days of ceftriaxone .  Overall she is feeling better.  She has less dyspnea and is no longer having fevers.  She saw Dr. Villa Greaser 10/22/2023.  No further antibiotics recommended.  Bronchoscopy will be considered if symptoms relapse or x-ray worsens.  She will follow-up with Dr. Alva at a 4-week interval.  She is scheduled for routine follow-up here on 10/30/2023.  She will contact the office in the  interim if symptoms worsen.    [name]   10/24/2023  1:27 PM

## 2023-10-29 ENCOUNTER — Encounter (HOSPITAL_BASED_OUTPATIENT_CLINIC_OR_DEPARTMENT_OTHER): Payer: Self-pay | Admitting: Pulmonary Disease

## 2023-10-30 ENCOUNTER — Inpatient Hospital Stay (HOSPITAL_BASED_OUTPATIENT_CLINIC_OR_DEPARTMENT_OTHER): Payer: BC Managed Care – PPO | Admitting: Nurse Practitioner

## 2023-10-30 ENCOUNTER — Other Ambulatory Visit: Payer: Self-pay

## 2023-10-30 ENCOUNTER — Other Ambulatory Visit: Payer: BC Managed Care – PPO

## 2023-10-30 ENCOUNTER — Inpatient Hospital Stay: Payer: BC Managed Care – PPO

## 2023-10-30 VITALS — BP 121/71 | HR 93 | Temp 98.1°F | Resp 18 | Ht 63.0 in | Wt 133.9 lb

## 2023-10-30 DIAGNOSIS — C8299 Follicular lymphoma, unspecified, extranodal and solid organ sites: Secondary | ICD-10-CM

## 2023-10-30 DIAGNOSIS — J189 Pneumonia, unspecified organism: Secondary | ICD-10-CM | POA: Diagnosis not present

## 2023-10-30 DIAGNOSIS — Z9221 Personal history of antineoplastic chemotherapy: Secondary | ICD-10-CM | POA: Diagnosis not present

## 2023-10-30 DIAGNOSIS — R110 Nausea: Secondary | ICD-10-CM | POA: Diagnosis not present

## 2023-10-30 DIAGNOSIS — Z8572 Personal history of non-Hodgkin lymphomas: Secondary | ICD-10-CM | POA: Diagnosis not present

## 2023-10-30 DIAGNOSIS — R509 Fever, unspecified: Secondary | ICD-10-CM | POA: Diagnosis not present

## 2023-10-30 DIAGNOSIS — H6692 Otitis media, unspecified, left ear: Secondary | ICD-10-CM | POA: Diagnosis not present

## 2023-10-30 DIAGNOSIS — R053 Chronic cough: Secondary | ICD-10-CM | POA: Diagnosis not present

## 2023-10-30 DIAGNOSIS — C8204 Follicular lymphoma grade I, lymph nodes of axilla and upper limb: Secondary | ICD-10-CM

## 2023-10-30 LAB — CMP (CANCER CENTER ONLY)
ALT: 13 U/L (ref 0–44)
AST: 17 U/L (ref 15–41)
Albumin: 4.4 g/dL (ref 3.5–5.0)
Alkaline Phosphatase: 76 U/L (ref 38–126)
Anion gap: 8 (ref 5–15)
BUN: 11 mg/dL (ref 6–20)
CO2: 29 mmol/L (ref 22–32)
Calcium: 9.6 mg/dL (ref 8.9–10.3)
Chloride: 100 mmol/L (ref 98–111)
Creatinine: 0.73 mg/dL (ref 0.44–1.00)
GFR, Estimated: >60 mL/min (ref >60)
Glucose, Bld: 96 mg/dL (ref 70–99)
Potassium: 4.1 mmol/L (ref 3.5–5.1)
Sodium: 137 mmol/L (ref 135–145)
Total Bilirubin: 0.3 mg/dL (ref 0.0–1.2)
Total Protein: 7.1 g/dL (ref 6.5–8.1)

## 2023-10-30 LAB — CBC WITH DIFFERENTIAL (CANCER CENTER ONLY)
Abs Immature Granulocytes: 0.02 10*3/uL (ref 0.00–0.07)
Basophils Absolute: 0 10*3/uL (ref 0.0–0.1)
Basophils Relative: 1 %
Eosinophils Absolute: 0.1 10*3/uL (ref 0.0–0.5)
Eosinophils Relative: 2 %
HCT: 36.8 % (ref 36.0–46.0)
Hemoglobin: 11.9 g/dL — ABNORMAL LOW (ref 12.0–15.0)
Immature Granulocytes: 0 %
Lymphocytes Relative: 9 %
Lymphs Abs: 0.5 10*3/uL — ABNORMAL LOW (ref 0.7–4.0)
MCH: 29.8 pg (ref 26.0–34.0)
MCHC: 32.3 g/dL (ref 30.0–36.0)
MCV: 92 fL (ref 80.0–100.0)
Monocytes Absolute: 0.5 10*3/uL (ref 0.1–1.0)
Monocytes Relative: 10 %
Neutro Abs: 4.1 10*3/uL (ref 1.7–7.7)
Neutrophils Relative %: 78 %
Platelet Count: 381 10*3/uL (ref 150–400)
RBC: 4 MIL/uL (ref 3.87–5.11)
RDW: 13.1 % (ref 11.5–15.5)
WBC Count: 5.2 10*3/uL (ref 4.0–10.5)
nRBC: 0 % (ref 0.0–0.2)

## 2023-10-30 LAB — LACTATE DEHYDROGENASE: LDH: 181 U/L (ref 98–192)

## 2023-10-30 NOTE — Progress Notes (Signed)
[facility] OFFICE PROGRESS NOTE   Diagnosis: Non-Hodgkin's lymphoma  INTERVAL HISTORY:   Adrienne Morgan returns as scheduled.  Adrienne Morgan continues to feel better.  Adrienne Morgan has less dyspnea.  Cough has become more productive especially in the mornings and at nighttime.  Adrienne Morgan is no longer having fevers.  Adrienne Morgan continues to note congestion in the left ear and drainage.  Adrienne Morgan has follow-up with ENT next month.  Objective:  Vital signs in last 24 hours:  Blood pressure 121/71, pulse 93, temperature 98.1 F (36.7 C), temperature source Temporal, resp. rate 18, height 5\' 3"  (1.6 m), weight 133 lb 14.4 oz (60.7 kg), SpO2 100%.    HEENT: No thrush or ulcers. Lymphatics: No palpable cervical, supraclavicular, axillary or inguinal lymph nodes. Resp: Faint rales at both lung bases right greater than left.  Faint rales right upper lung field.  No respiratory distress. Cardio: Regular rate and rhythm. GI: No hepatosplenomegaly. Vascular: No leg edema.   Lab Results:  Lab Results  Component Value Date   WBC 5.2 10/30/2023   HGB 11.9 (L) 10/30/2023   HCT 36.8 10/30/2023   MCV 92.0 10/30/2023   PLT 381 10/30/2023   NEUTROABS 4.1 10/30/2023    Imaging:  No results found.  Medications: I have reviewed the patient's current medications.  Assessment/Plan: Follicular lymphoma-low-grade, clinical stage III, FLIPI-intermediate risk Bilateral axillary ultrasound 08/29/2023-mildly prominent lymph nodes, right axillary node without a defined preserved fatty hilum Ultrasound-guided biopsy of right axillary lymph node 12/19/2022-follicular lymphoma, overall grade 1 of 3, focal grade 2, Ki-67 less than 10% with areas of under 5% and areas at 15-20%, CD20, BCL6, CD10 positive.  Bcl-2 positive.  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 PET 01/04/2023-mild to moderate FDG activity associated with borderline enlarged bilateral axillary, retroperitoneal, mesenteric, right inguinal, and  right external iliac nodes, mild diffuse increased uptake in the spleen Cycle 1 Bendamustine/rituximab 01/24/2023 Cycle 2 Bendamustine/rituximab 02/21/2023, day 2 Bendamustine held secondary to fever, arthralgias, and rash Cycle 3 Bendamustine/rituximab 03/20/2023 Cycle 4 Bendamustine/rituximab 04/18/2023 PET 05/11/2023-resolution of hypermetabolic lymph nodes in the neck, chest, abdomen, and pelvis, Deauville 1, resolution of splenic hypermetabolism, no evidence of residual metabolically active lymphoma. Cycle 5 Bendamustine/rituximab 05/16/2023 Cycle 6 Bendamustine/rituximab 06/13/2023 PCOS multiple miscarriages History of a colon polyp-tubular adenoma Family history of colon and esophagus cancer, and sister with non-Hodgkin's lymphoma Upper respiratory infection beginning September 2024, multiple courses of Levaquin, treatment with prednisone, course of cefdinir, report of x-rays via primary provider's office showing lower lung infiltrates CT chest 10/16/2023: Dense consolidation in the bilateral lung base, no lymphadenopathy Course of ceftriaxone beginning 10/16/2023 x 6 doses 6.  Left ear infection-status post left myringotomy with tube placement 10/01/2023  Disposition: Adrienne Morgan appears stable.  Adrienne Morgan remains in clinical remission from non-Hodgkin's lymphoma.    Respiratory symptoms continue to improve.  No longer having fevers.  Adrienne Morgan has follow-up with Dr. Vassie Loll 11/19/2022.  Adrienne Morgan will follow-up with Dr. Jenne Pane, ENT regarding the left ear infection.  We reviewed the labs from today.  LDH is in normal range.  We added immunoglobulin levels.  Adrienne Morgan will return for lab and follow-up in 3 months.  We are available to see her sooner if needed.  Patient seen with Dr. Truett Perna.   [name] ANP/GNP-BC   10/30/2023  3:27 PM   This was a shared visit with [name].  Adrienne Morgan was interviewed and examined.  Adrienne Morgan is experienced clinical improvement following treatment with ceftriaxone.  Adrienne Morgan will follow-up  with Dr. Vassie Loll for the pneumonia and with ENT for the ear infection.  We will check immunoglobulin levels today.  I was present for greater than 50% of today's visit.  I performed medical decision making.  Mancel Bale, MD  Mancel Bale, MD

## 2023-10-31 ENCOUNTER — Inpatient Hospital Stay: Payer: BC Managed Care – PPO

## 2023-10-31 DIAGNOSIS — R053 Chronic cough: Secondary | ICD-10-CM | POA: Diagnosis not present

## 2023-10-31 DIAGNOSIS — J189 Pneumonia, unspecified organism: Secondary | ICD-10-CM | POA: Diagnosis not present

## 2023-10-31 DIAGNOSIS — Z9221 Personal history of antineoplastic chemotherapy: Secondary | ICD-10-CM | POA: Diagnosis not present

## 2023-10-31 DIAGNOSIS — Z8572 Personal history of non-Hodgkin lymphomas: Secondary | ICD-10-CM | POA: Diagnosis not present

## 2023-10-31 DIAGNOSIS — C8299 Follicular lymphoma, unspecified, extranodal and solid organ sites: Secondary | ICD-10-CM

## 2023-10-31 DIAGNOSIS — R110 Nausea: Secondary | ICD-10-CM | POA: Diagnosis not present

## 2023-10-31 DIAGNOSIS — H6692 Otitis media, unspecified, left ear: Secondary | ICD-10-CM | POA: Diagnosis not present

## 2023-10-31 DIAGNOSIS — R509 Fever, unspecified: Secondary | ICD-10-CM | POA: Diagnosis not present

## 2023-11-01 ENCOUNTER — Other Ambulatory Visit: Payer: Self-pay

## 2023-11-01 LAB — IGG, IGA, IGM
IgA: 67 mg/dL — ABNORMAL LOW (ref 87–352)
IgG (Immunoglobin G), Serum: 787 mg/dL (ref 586–1602)
IgM (Immunoglobulin M), Srm: 100 mg/dL (ref 26–217)

## 2023-11-07 ENCOUNTER — Other Ambulatory Visit: Payer: Self-pay

## 2023-11-09 LAB — PNEUMOCYSTIS JIROVECI SMEAR BY DFA

## 2023-11-12 DIAGNOSIS — Z9622 Myringotomy tube(s) status: Secondary | ICD-10-CM | POA: Diagnosis not present

## 2023-11-12 DIAGNOSIS — H9212 Otorrhea, left ear: Secondary | ICD-10-CM | POA: Diagnosis not present

## 2023-11-20 ENCOUNTER — Encounter (HOSPITAL_BASED_OUTPATIENT_CLINIC_OR_DEPARTMENT_OTHER): Payer: Self-pay | Admitting: Pulmonary Disease

## 2023-11-20 ENCOUNTER — Ambulatory Visit (HOSPITAL_BASED_OUTPATIENT_CLINIC_OR_DEPARTMENT_OTHER): Payer: BC Managed Care – PPO | Admitting: Pulmonary Disease

## 2023-11-20 ENCOUNTER — Ambulatory Visit (HOSPITAL_BASED_OUTPATIENT_CLINIC_OR_DEPARTMENT_OTHER): Payer: BC Managed Care – PPO

## 2023-11-20 VITALS — BP 112/73 | HR 83 | Ht 63.0 in | Wt 131.0 lb

## 2023-11-20 DIAGNOSIS — R918 Other nonspecific abnormal finding of lung field: Secondary | ICD-10-CM | POA: Diagnosis not present

## 2023-11-20 DIAGNOSIS — J90 Pleural effusion, not elsewhere classified: Secondary | ICD-10-CM | POA: Diagnosis not present

## 2023-11-20 DIAGNOSIS — J189 Pneumonia, unspecified organism: Secondary | ICD-10-CM

## 2023-11-20 MED ORDER — PROMETHAZINE-DM 6.25-15 MG/5ML PO SYRP: 5.0000 mL | ORAL_SOLUTION | Freq: Two times a day (BID) | ORAL | 1 refills | Status: DC | PRN

## 2023-11-20 NOTE — Progress Notes (Signed)
   Subjective:    Patient ID: Adrienne Morgan, female    DOB: May 12, 1976, 48 y.o.   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 She has a history of low-grade follicular lymphoma and is undergoing monthly Bendamustine rituximab treatment, last dose was 06/13/23.  She developed URI symptoms in September 2024 and since then has undergone multiple courses of Levaquin with a course of prednisone   'She developed sinus congestion beginning in September.  She experienced initial improvement after 10 days of Levaquin.  Her symptoms progressed in October as she completed another 7-day course of Levaquin beginning 07/30/2023.  She had a persistent cough and completed 7 days of prednisone beginning 08/27/2023.  The cough did not improve with prednisone.   She began cefdinir for 10 days 09/02/2023.  Her symptoms persisted.  She reports a chest x-ray via [facility] 09/11/2023 revealed bilateral lower lung infiltrates.  She completed 10 more days of Levaquin with some improvement.  A repeat chest x-ray 09/25/2023 revealed improvement in the left lung infiltrate but a persistent right lower lung infiltrate.'   CT chest 10/16/2023 showed dense consolidation in both lung bases. She received daily ceftriaxone beginning 1/7  x 6 doses ,last today CXR 1/15 BLL pneumonia    Sputum Gram stain showed gram-negative rods, no staph or Pseudomonas isolated.  Chief Complaint  Patient presents with   Cough    Productive cough still, burning in chest   She is somewhat improved and back to work, energy is better.  2 weeks ago she had low-grade fever for 2 days.  She continues to have purulent sputum.  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 Also denies presyncope, palpitations, heartburn, abdominal pain, nausea, vomiting, diarrhea or change in bowel or urinary habits, dysuria,hematuria, rash, arthralgias, visual complaints, headache, numbness weakness or ataxia.     Objective:   Physical Exam  Gen. Pleasant, well-nourished, in no distress ENT - no thrush, no pallor/icterus,no post nasal drip Neck: No JVD, no thyromegaly, no carotid bruits, RT basal rales no rhonchi  Cardiovascular: Rhythm regular, heart sounds  normal, no murmurs or gallops, no peripheral edema Musculoskeletal: No deformities, no cyanosis or clubbing        Assessment & Plan:     She seems to have a slow to resolve community-acquired pneumonia with persistent symptoms of green sputum.  Due to her immunocompromise status we will proceed with endoscopy with BAL to make sure we are not dealing with resistant organism Chest x-ray shows improving right lower lobe infiltrate  The risks of each procedure including coughing, bleeding and the  chances of lung puncture requiring chest tube were discussed in great detail. The benefits & alternatives including serial follow up were also discussed.

## 2023-11-20 NOTE — Patient Instructions (Signed)
x chest x-ray today -based on this we will decide about bronchoscopy vs more antibiotics  x Promethazine DM cough syrup prescription sent

## 2023-11-20 NOTE — H&P (View-Only) (Signed)
   Subjective:    Patient ID: Adrienne Morgan, female    DOB: May 12, 1976, 48 y.o.   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 She has a history of low-grade follicular lymphoma and is undergoing monthly Bendamustine rituximab treatment, last dose was 06/13/23.  She developed URI symptoms in September 2024 and since then has undergone multiple courses of Levaquin with a course of prednisone   'She developed sinus congestion beginning in September.  She experienced initial improvement after 10 days of Levaquin.  Her symptoms progressed in October as she completed another 7-day course of Levaquin beginning 07/30/2023.  She had a persistent cough and completed 7 days of prednisone beginning 08/27/2023.  The cough did not improve with prednisone.   She began cefdinir for 10 days 09/02/2023.  Her symptoms persisted.  She reports a chest x-ray via [facility] 09/11/2023 revealed bilateral lower lung infiltrates.  She completed 10 more days of Levaquin with some improvement.  A repeat chest x-ray 09/25/2023 revealed improvement in the left lung infiltrate but a persistent right lower lung infiltrate.'   CT chest 10/16/2023 showed dense consolidation in both lung bases. She received daily ceftriaxone beginning 1/7  x 6 doses ,last today CXR 1/15 BLL pneumonia    Sputum Gram stain showed gram-negative rods, no staph or Pseudomonas isolated.  Chief Complaint  Patient presents with   Cough    Productive cough still, burning in chest   She is somewhat improved and back to work, energy is better.  2 weeks ago she had low-grade fever for 2 days.  She continues to have purulent sputum.  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 Also denies presyncope, palpitations, heartburn, abdominal pain, nausea, vomiting, diarrhea or change in bowel or urinary habits, dysuria,hematuria, rash, arthralgias, visual complaints, headache, numbness weakness or ataxia.     Objective:   Physical Exam  Gen. Pleasant, well-nourished, in no distress ENT - no thrush, no pallor/icterus,no post nasal drip Neck: No JVD, no thyromegaly, no carotid bruits, RT basal rales no rhonchi  Cardiovascular: Rhythm regular, heart sounds  normal, no murmurs or gallops, no peripheral edema Musculoskeletal: No deformities, no cyanosis or clubbing        Assessment & Plan:     She seems to have a slow to resolve community-acquired pneumonia with persistent symptoms of green sputum.  Due to her immunocompromise status we will proceed with endoscopy with BAL to make sure we are not dealing with resistant organism Chest x-ray shows improving right lower lobe infiltrate  The risks of each procedure including coughing, bleeding and the  chances of lung puncture requiring chest tube were discussed in great detail. The benefits & alternatives including serial follow up were also discussed.

## 2023-11-21 ENCOUNTER — Other Ambulatory Visit (HOSPITAL_BASED_OUTPATIENT_CLINIC_OR_DEPARTMENT_OTHER): Payer: Self-pay | Admitting: Pulmonary Disease

## 2023-11-21 ENCOUNTER — Encounter: Payer: Self-pay | Admitting: Pulmonary Disease

## 2023-11-21 DIAGNOSIS — J189 Pneumonia, unspecified organism: Secondary | ICD-10-CM | POA: Insufficient documentation

## 2023-11-23 ENCOUNTER — Encounter (HOSPITAL_COMMUNITY): Payer: Self-pay | Admitting: Pulmonary Disease

## 2023-11-23 ENCOUNTER — Other Ambulatory Visit: Payer: Self-pay

## 2023-11-23 NOTE — Progress Notes (Signed)
PCP - Chilton Greathouse, MD  Chest x-ray - 11/20/23  Anesthesia review: N  Pt currently has a productive cough with colored sputum and a burning sensation when taking a deep breath. No other symptoms currently. Told her to call us if anything new develops  Patient verbally denies any shortness of breath, fever, cough and chest pain during phone call   -------------  SDW INSTRUCTIONS given:  Your procedure is scheduled on Monday, February 17th.  Report to [facility] Main Entrance "A" at 0630 A.M., and check in at the Admitting office.  Call this number if you have problems the morning of surgery:  [phone]   Remember:  Do not eat or drink after midnight the night before your surgery    Take these medicines the morning of surgery with A SIP OF WATER  cetirizine (ZYRTEC)  escitalopram (LEXAPRO)  (PROMETHAZINE-DM)-if needed   As of today, STOP taking any Aspirin (unless otherwise instructed by your surgeon) Aleve, Naproxen, Ibuprofen, Motrin, Advil, Goody's, BC's, all herbal medications, fish oil, and all vitamins.                      Do not wear jewelry, make up, or nail polish            Do not wear lotions, powders, perfumes/colognes, or deodorant.            Do not shave 48 hours prior to surgery.  Men may shave face and neck.            Do not bring valuables to the hospital.            [facility] is not responsible for any belongings or valuables.  Do NOT Smoke (Tobacco/Vaping) 24 hours prior to your procedure If you use a CPAP at night, you may bring all equipment for your overnight stay.   Contacts, glasses, dentures or bridgework may not be worn into surgery.      For patients admitted to the hospital, discharge time will be determined by your treatment team.   Patients discharged the day of surgery will not be allowed to drive home, and someone needs to stay with them for 24 hours.    Special instructions:   [facility]- Preparing For Surgery  Before  surgery, you can play an important role. Because skin is not sterile, your skin needs to be as free of germs as possible. You can reduce the number of germs on your skin by washing with CHG (chlorahexidine gluconate) Soap before surgery.  CHG is an antiseptic cleaner which kills germs and bonds with the skin to continue killing germs even after washing.    Oral Hygiene is also important to reduce your risk of infection.  Remember - BRUSH YOUR TEETH THE MORNING OF SURGERY WITH YOUR REGULAR TOOTHPASTE  Please do not use if you have an allergy to CHG or antibacterial soaps. If your skin becomes reddened/irritated stop using the CHG.  Do not shave (including legs and underarms) for at least 48 hours prior to first CHG shower. It is OK to shave your face.  Please follow these instructions carefully.   Shower the NIGHT BEFORE SURGERY and the MORNING OF SURGERY with DIAL Soap.   Pat yourself dry with a CLEAN TOWEL.  Wear CLEAN PAJAMAS to bed the night before surgery  Place CLEAN SHEETS on your bed the night of your first shower and DO NOT SLEEP WITH PETS.   Day of Surgery: Please shower  morning of surgery  Wear Clean/Comfortable clothing the morning of surgery Do not apply any deodorants/lotions.   Remember to brush your teeth WITH YOUR REGULAR TOOTHPASTE.   Questions were answered. Patient verbalized understanding of instructions.

## 2023-11-26 ENCOUNTER — Ambulatory Visit (HOSPITAL_COMMUNITY): Payer: BC Managed Care – PPO | Admitting: Anesthesiology

## 2023-11-26 ENCOUNTER — Encounter (HOSPITAL_COMMUNITY)
Admission: RE | Disposition: A | Discharge status: Home / Self Care | Payer: Self-pay | Source: Home / Self Care | Attending: Pulmonary Disease

## 2023-11-26 ENCOUNTER — Ambulatory Visit (HOSPITAL_COMMUNITY)
Admission: RE | Admit: 2023-11-26 | Discharge: 2023-11-26 | Disposition: A | Discharge status: Home / Self Care | Payer: BC Managed Care – PPO | Attending: Pulmonary Disease | Admitting: Pulmonary Disease

## 2023-11-26 ENCOUNTER — Encounter (HOSPITAL_COMMUNITY): Payer: Self-pay | Admitting: Pulmonary Disease

## 2023-11-26 DIAGNOSIS — Z79630 Long term (current) use of alkylating agent: Secondary | ICD-10-CM | POA: Diagnosis not present

## 2023-11-26 DIAGNOSIS — J189 Pneumonia, unspecified organism: Secondary | ICD-10-CM | POA: Insufficient documentation

## 2023-11-26 DIAGNOSIS — C8290 Follicular lymphoma, unspecified, unspecified site: Secondary | ICD-10-CM | POA: Diagnosis not present

## 2023-11-26 HISTORY — DX: Non-Hodgkin lymphoma, unspecified, unspecified site: C85.90

## 2023-11-26 HISTORY — PX: VIDEO BRONCHOSCOPY: SHX5072

## 2023-11-26 HISTORY — PX: BRONCHIAL WASHINGS: SHX5105

## 2023-11-26 HISTORY — DX: Malignant (primary) neoplasm, unspecified: C80.1

## 2023-11-26 LAB — BODY FLUID CELL COUNT WITH DIFFERENTIAL
Eos, Fluid: 0 %
Lymphs, Fluid: 2 %
Monocyte-Macrophage-Serous Fluid: 2 % — ABNORMAL LOW (ref 50–90)
Neutrophil Count, Fluid: 96 % — ABNORMAL HIGH (ref 0–25)
Total Nucleated Cell Count, Fluid: 1590 uL — ABNORMAL HIGH (ref 0–1000)

## 2023-11-26 LAB — POCT PREGNANCY, URINE: Preg Test, Ur: NEGATIVE

## 2023-11-26 SURGERY — VIDEO BRONCHOSCOPY WITHOUT FLUORO
Anesthesia: General | Laterality: Bilateral

## 2023-11-26 MED ORDER — SODIUM CHLORIDE 0.9 % IV SOLN: 12.5000 mg | INTRAVENOUS | Status: DC | PRN

## 2023-11-26 MED ORDER — OXYCODONE HCL 5 MG/5ML PO SOLN: 5.0000 mg | Freq: Once | ORAL | Status: DC | PRN

## 2023-11-26 MED ORDER — CHLORHEXIDINE GLUCONATE 0.12 % MT SOLN
15.0000 mL | OROMUCOSAL | Status: AC
  Administered 2023-11-26: 15 mL via OROMUCOSAL
  Filled 2023-11-26: qty 15

## 2023-11-26 MED ORDER — LACTATED RINGERS IV SOLN: INTRAVENOUS | Status: DC

## 2023-11-26 MED ORDER — PROPOFOL 500 MG/50ML IV EMUL
INTRAVENOUS | Status: DC | PRN
  Administered 2023-11-26: 150 ug/kg/min via INTRAVENOUS

## 2023-11-26 MED ORDER — HYDROMORPHONE HCL 1 MG/ML IJ SOLN: 0.2500 mg | INTRAMUSCULAR | Status: DC | PRN

## 2023-11-26 MED ORDER — MEPERIDINE HCL 25 MG/ML IJ SOLN: 6.2500 mg | INTRAMUSCULAR | Status: DC | PRN

## 2023-11-26 MED ORDER — SUCCINYLCHOLINE CHLORIDE 200 MG/10ML IV SOSY
PREFILLED_SYRINGE | INTRAVENOUS | Status: DC | PRN
  Administered 2023-11-26: 100 mg via INTRAVENOUS

## 2023-11-26 MED ORDER — ONDANSETRON HCL 4 MG/2ML IJ SOLN
INTRAMUSCULAR | Status: DC | PRN
  Administered 2023-11-26: 4 mg via INTRAVENOUS

## 2023-11-26 MED ORDER — AMISULPRIDE (ANTIEMETIC) 5 MG/2ML IV SOLN: 10.0000 mg | Freq: Once | INTRAVENOUS | Status: DC | PRN

## 2023-11-26 MED ORDER — PROPOFOL 10 MG/ML IV BOLUS
INTRAVENOUS | Status: DC | PRN
  Administered 2023-11-26: 150 mg via INTRAVENOUS

## 2023-11-26 MED ORDER — ROCURONIUM BROMIDE 10 MG/ML (PF) SYRINGE
PREFILLED_SYRINGE | INTRAVENOUS | Status: DC | PRN
  Administered 2023-11-26: 10 mg via INTRAVENOUS

## 2023-11-26 MED ORDER — LIDOCAINE 2% (20 MG/ML) 5 ML SYRINGE
INTRAMUSCULAR | Status: DC | PRN
  Administered 2023-11-26: 100 mg via INTRAVENOUS

## 2023-11-26 MED ORDER — OXYCODONE HCL 5 MG PO TABS: 5.0000 mg | ORAL_TABLET | Freq: Once | ORAL | Status: DC | PRN

## 2023-11-26 MED ORDER — SUGAMMADEX SODIUM 200 MG/2ML IV SOLN
INTRAVENOUS | Status: DC | PRN
  Administered 2023-11-26: 200 mg via INTRAVENOUS

## 2023-11-26 MED ORDER — LACTATED RINGERS IV SOLN: INTRAVENOUS | Status: DC | PRN

## 2023-11-26 NOTE — Anesthesia Preprocedure Evaluation (Signed)
Anesthesia Evaluation  Patient identified by MRN, date of birth, ID band Patient awake    Reviewed: Allergy & Precautions, H&P , NPO status , Patient's Chart, lab work & pertinent test results  History of Anesthesia Complications (+) PONV and history of anesthetic complications  Airway Mallampati: I  TM Distance: >3 FB Neck ROM: full    Dental  (+) Teeth Intact   Pulmonary pneumonia, former smoker   Pulmonary exam normal breath sounds clear to auscultation       Cardiovascular negative cardio ROS Normal cardiovascular exam Rhythm:Regular Rate:Normal     Neuro/Psych   Anxiety     negative neurological ROS     GI/Hepatic negative GI ROS, Neg liver ROS,,,  Endo/Other  negative endocrine ROS    Renal/GU Hx nephrolithiasis  negative genitourinary   Musculoskeletal negative musculoskeletal ROS (+)    Abdominal   Peds  Hematology negative hematology ROS (+)   Anesthesia Other Findings       Reproductive/Obstetrics PCOS; Missed abortion at ~ 8 weeks                             Anesthesia Physical Anesthesia Plan  ASA: II  Anesthesia Plan: General   Post-op Pain Management: Minimal or no pain anticipated   Induction: Intravenous  PONV Risk Score and Plan: 4 or greater and Propofol infusion, Treatment may vary due to age or medical condition and TIVA  Airway Management Planned: Oral ETT  Additional Equipment: None  Intra-op Plan:   Post-operative Plan: Extubation in OR  Informed Consent: I have reviewed the patients History and Physical, chart, labs and discussed the procedure including the risks, benefits and alternatives for the proposed anesthesia with the patient or authorized representative who has indicated his/her understanding and acceptance.       Plan Discussed with: CRNA  Anesthesia Plan Comments: ( )        Anesthesia Quick Evaluation

## 2023-11-26 NOTE — Anesthesia Procedure Notes (Signed)
Procedure Name: Intubation Date/Time: 11/26/2023 9:20 AM  Performed by: Allyn Kenner, CRNAPre-anesthesia Checklist: Patient identified, Emergency Drugs available, Suction available and Patient being monitored Patient Re-evaluated:Patient Re-evaluated prior to induction Oxygen Delivery Method: Circle System Utilized Preoxygenation: Pre-oxygenation with 100% oxygen Induction Type: IV induction Ventilation: Mask ventilation without difficulty Laryngoscope Size: Mac and 3 Grade View: Grade I Tube type: Oral Tube size: 8.5 mm Number of attempts: 1 Airway Equipment and Method: Stylet and Oral airway Placement Confirmation: ETT inserted through vocal cords under direct vision, positive ETCO2 and breath sounds checked- equal and bilateral Tube secured with: Tape Dental Injury: Teeth and Oropharynx as per pre-operative assessment

## 2023-11-26 NOTE — Transfer of Care (Signed)
Immediate Anesthesia Transfer of Care Note  Patient: Adrienne Morgan  Procedure(s) Performed: VIDEO BRONCHOSCOPY WITHOUT FLUORO (Bilateral) BRONCHIAL WASHINGS  Patient Location: PACU  Anesthesia Type:General  Level of Consciousness: awake, alert , and oriented  Airway & Oxygen Therapy: Patient Spontanous Breathing and Patient connected to nasal cannula oxygen  Post-op Assessment: Report given to RN and Post -op Vital signs reviewed and stable  Post vital signs: Reviewed and stable  Last Vitals:  Vitals Value Taken Time  BP 114/70 11/26/23 0949  Temp 36.5 C 11/26/23 0949  Pulse 96 11/26/23 0952  Resp 19 11/26/23 0952  SpO2 98 % 11/26/23 0952  Vitals shown include unfiled device data.  Last Pain:  Vitals:   11/26/23 0700  TempSrc:   PainSc: 0-No pain      Patients Stated Pain Goal: 0 (11/26/23 0700)  Complications: No notable events documented.

## 2023-11-26 NOTE — Interval H&P Note (Signed)
History and Physical Interval Note:  11/26/2023 9:05 AM  Adrienne Morgan  has presented today for surgery, with the diagnosis of pneumonia.  The various methods of treatment have been discussed with the patient and family. After consideration of risks, benefits and other options for treatment, the patient has consented to  Procedure(s): VIDEO BRONCHOSCOPY WITHOUT FLUORO (Bilateral) as a surgical intervention.  The patient's history has been reviewed, patient examined, no change in status, stable for surgery.  I have reviewed the patient's chart and labs.  Questions were answered to the patient's satisfaction.     Comer Locket Vassie Loll

## 2023-11-26 NOTE — Anesthesia Postprocedure Evaluation (Signed)
Anesthesia Post Note  Patient: MARDEE CLUNE  Procedure(s) Performed: VIDEO BRONCHOSCOPY WITHOUT FLUORO (Bilateral) BRONCHIAL WASHINGS     Patient location during evaluation: PACU Anesthesia Type: General Level of consciousness: awake and alert Pain management: pain level controlled Vital Signs Assessment: post-procedure vital signs reviewed and stable Respiratory status: spontaneous breathing, nonlabored ventilation and respiratory function stable Cardiovascular status: blood pressure returned to baseline and stable Postop Assessment: no apparent nausea or vomiting Anesthetic complications: no   There were no known notable events for this encounter.  Last Vitals:  Vitals:   11/26/23 1000 11/26/23 1009  BP: 107/67   Pulse: 90   Resp: 18   Temp:  36.5 C  SpO2: 98%     Last Pain:  Vitals:   11/26/23 1009  TempSrc:   PainSc: 0-No pain                 Lowella Curb

## 2023-11-26 NOTE — Op Note (Addendum)
  Name:  Adrienne Morgan MRN:  [id number] DOB:  03-Oct-1976  PROCEDURE NOTE  Procedure(s): Flexible bronchoscopy [phone]) Bronchial alveolar lavage [phone]) of the RLL   Indications:  Slow to resolve bibasal pneumonia  Consent:  Written informed consent was obtained prior to the procedure. The risks of the procedure including coughing, bleeding and the small chance of lung puncture requiring chest tube were discussed in great detail. The benefits & alternatives including serial follow up were also discussed.  Anesthesia:  General endotracheal.  Procedure summary:  Appropriate equipment was assembled.  The patient was  identified as Adrienne Morgan. Interim history obtained and brought to the endoscopy room. Safety timeout was performed. The patient was placed supine on the operating table, airway established and general anesthesia administered by Anesthesia team.   After the appropriate level of anesthesia was assured, flexible video bronchoscope was lubricated and inserted through the endotracheal tube.    Airway examination was performed bilaterally to subsegmental level.  Minima white secretions were noted, mucosa appeared normal and no endobronchial lesions were identified.  BAL obtained from RLL.  After ensuring hemostasis , the bronchoscope was withdrawn.  The patient was extubated in endoscopy room and transferred to PACU.   Specimens sent: Bronchial alveolar lavage specimen of the RLL for  microbiology, PJP, cell count and cytology.  Complications:  No immediate complications were noted.  Hemodynamic parameters and oxygenation remained stable throughout the procedure.  Estimated blood loss:  Less then 5 mL.   Cyril Mourning MD. Tonny Bollman. [facility] Pager [phone] If no response call [phone]   11/26/2023 9:40 AM

## 2023-11-27 ENCOUNTER — Encounter (HOSPITAL_BASED_OUTPATIENT_CLINIC_OR_DEPARTMENT_OTHER): Payer: Self-pay | Admitting: Pulmonary Disease

## 2023-11-27 DIAGNOSIS — Z9622 Myringotomy tube(s) status: Secondary | ICD-10-CM | POA: Diagnosis not present

## 2023-11-27 DIAGNOSIS — H9212 Otorrhea, left ear: Secondary | ICD-10-CM | POA: Diagnosis not present

## 2023-11-27 LAB — CYTOLOGY - NON PAP

## 2023-11-28 LAB — ACID FAST SMEAR (AFB, MYCOBACTERIA): Acid Fast Smear: NEGATIVE

## 2023-11-28 LAB — CULTURE, BAL-QUANTITATIVE W GRAM STAIN

## 2023-11-28 NOTE — Progress Notes (Signed)
 Pt.notified

## 2023-12-01 LAB — AEROBIC/ANAEROBIC CULTURE W GRAM STAIN (SURGICAL/DEEP WOUND): Culture: NORMAL

## 2023-12-05 ENCOUNTER — Other Ambulatory Visit: Payer: Self-pay

## 2023-12-06 DIAGNOSIS — Z9622 Myringotomy tube(s) status: Secondary | ICD-10-CM | POA: Diagnosis not present

## 2023-12-06 DIAGNOSIS — H9072 Mixed conductive and sensorineural hearing loss, unilateral, left ear, with unrestricted hearing on the contralateral side: Secondary | ICD-10-CM | POA: Diagnosis not present

## 2023-12-06 DIAGNOSIS — Z9221 Personal history of antineoplastic chemotherapy: Secondary | ICD-10-CM | POA: Diagnosis not present

## 2023-12-06 DIAGNOSIS — X58XXXD Exposure to other specified factors, subsequent encounter: Secondary | ICD-10-CM | POA: Diagnosis not present

## 2023-12-06 DIAGNOSIS — H9212 Otorrhea, left ear: Secondary | ICD-10-CM | POA: Diagnosis not present

## 2023-12-06 DIAGNOSIS — T451X5D Adverse effect of antineoplastic and immunosuppressive drugs, subsequent encounter: Secondary | ICD-10-CM | POA: Diagnosis not present

## 2023-12-06 DIAGNOSIS — Z0110 Encounter for examination of ears and hearing without abnormal findings: Secondary | ICD-10-CM | POA: Diagnosis not present

## 2023-12-07 DIAGNOSIS — Z9622 Myringotomy tube(s) status: Secondary | ICD-10-CM | POA: Diagnosis not present

## 2023-12-07 DIAGNOSIS — C851A Unspecified b-cell lymphoma, in remission: Secondary | ICD-10-CM | POA: Diagnosis not present

## 2023-12-07 DIAGNOSIS — H7012 Chronic mastoiditis, left ear: Secondary | ICD-10-CM | POA: Diagnosis not present

## 2023-12-07 DIAGNOSIS — H9212 Otorrhea, left ear: Secondary | ICD-10-CM | POA: Diagnosis not present

## 2023-12-07 DIAGNOSIS — Z4589 Encounter for adjustment and management of other implanted devices: Secondary | ICD-10-CM | POA: Diagnosis not present

## 2023-12-07 DIAGNOSIS — H9012 Conductive hearing loss, unilateral, left ear, with unrestricted hearing on the contralateral side: Secondary | ICD-10-CM | POA: Diagnosis not present

## 2023-12-12 ENCOUNTER — Other Ambulatory Visit: Payer: Self-pay

## 2023-12-13 DIAGNOSIS — H9012 Conductive hearing loss, unilateral, left ear, with unrestricted hearing on the contralateral side: Secondary | ICD-10-CM | POA: Diagnosis not present

## 2023-12-17 ENCOUNTER — Ambulatory Visit (HOSPITAL_BASED_OUTPATIENT_CLINIC_OR_DEPARTMENT_OTHER): Payer: BC Managed Care – PPO | Admitting: Pulmonary Disease

## 2023-12-17 ENCOUNTER — Ambulatory Visit (HOSPITAL_BASED_OUTPATIENT_CLINIC_OR_DEPARTMENT_OTHER)

## 2023-12-17 ENCOUNTER — Encounter (HOSPITAL_BASED_OUTPATIENT_CLINIC_OR_DEPARTMENT_OTHER): Payer: Self-pay | Admitting: Pulmonary Disease

## 2023-12-17 VITALS — BP 112/68 | HR 100 | Wt 125.0 lb

## 2023-12-17 DIAGNOSIS — R053 Chronic cough: Secondary | ICD-10-CM | POA: Diagnosis not present

## 2023-12-17 DIAGNOSIS — H7010 Chronic mastoiditis, unspecified ear: Secondary | ICD-10-CM

## 2023-12-17 DIAGNOSIS — Z87891 Personal history of nicotine dependence: Secondary | ICD-10-CM

## 2023-12-17 DIAGNOSIS — J189 Pneumonia, unspecified organism: Secondary | ICD-10-CM

## 2023-12-17 NOTE — Patient Instructions (Addendum)
 CXR today  Take chlortrimeton OTC 4 mg around bedtime (instead of zyrtec ) x 1 week  Good luck with surgery

## 2023-12-17 NOTE — Progress Notes (Signed)
   Subjective:    Patient ID: Adrienne Morgan, female    DOB: 1976-08-19, 48 y.o.   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 She has a history of low-grade follicular lymphoma and is undergoing monthly Bendamustine rituximab treatment, last dose was 06/13/23.  She developed URI symptoms in September 2024 and since then has undergone multiple courses of Levaquin with a course of prednisone   'She developed sinus congestion beginning in September.  She experienced initial improvement after 10 days of Levaquin.  Her symptoms progressed in October as she completed another 7-day course of Levaquin beginning 07/30/2023.  She had a persistent cough and completed 7 days of prednisone beginning 08/27/2023.  The cough did not improve with prednisone.   She began cefdinir for 10 days 09/02/2023.  Her symptoms persisted.  She reports a chest x-ray via [facility]CPon 09/11/2023 revealed bilateral lower lung infiltrates.  She completed 10 more days of Levaquin with some improvement.  A repeat chest x-ray 09/25/2023 revealed improvement in the left lung infiltrate but a persistent right lower lung infiltrate.'   CT chest 10/16/2023 showed dense consolidation in both lung bases. She received daily ceftriaxone beginning 1/7  x 6 doses ,last today CXR 1/15 BLL pneumonia   1 month follow-up visit postendoscopy We discussed bronchoscopy results Sputum Gram stain showed gram-negative rods, no staph or Pseudomonas isolated. 11/26/23 BAL cx neg BAL 1590 cells, 96% segs , cytology neg  She continues to have a cough productive of minimal amounts of green sputum.  She reports a postnasal drip, cough is worse in the morning and again when she lies down at night She has seen a different ENT and I reviewed, consultation , mastoidectomy planned with the flap    Review of Systems neg for any significant sore throat, dysphagia, itching, sneezing, nasal congestion or excess/ purulent secretions, fever, chills,  sweats, unintended wt loss, pleuritic or exertional cp, hempoptysis, orthopnea pnd or change in chronic leg swelling. Also denies presyncope, palpitations, heartburn, abdominal pain, nausea, vomiting, diarrhea or change in bowel or urinary habits, dysuria,hematuria, rash, arthralgias, visual complaints, headache, numbness weakness or ataxia.     Objective:   Physical Exam  Gen. Pleasant, well-nourished, in no distress ENT - no thrush, no pallor/icterus,no post nasal drip Neck: No JVD, no thyromegaly, no carotid bruits Lungs: no use of accessory muscles, no dullness to percussion, clear without rales or rhonchi  Cardiovascular: Rhythm regular, heart sounds  normal, no murmurs or gallops, no peripheral edema Musculoskeletal: No deformities, no cyanosis or clubbing        Assessment & Plan:    Right lower lobe community-acquired pneumonia -slow to resolve, my concern was about MRSA or other resistant organism but that does not seem to be the case.  Last chest x-ray did show some improvement and we will repeat chest x-ray today. We will be able to clear her based on this Chest x-ray showed improved infiltrate  Postnasal drip and persistent cough -do not need she needs further antibiotics, although she may need antibiotics for chronic mastoiditis Have asked him to switch from Zyrtec to Chlor-Trimeton for 1 to 2 weeks

## 2023-12-19 DIAGNOSIS — Z01419 Encounter for gynecological examination (general) (routine) without abnormal findings: Secondary | ICD-10-CM | POA: Diagnosis not present

## 2023-12-19 DIAGNOSIS — Z1231 Encounter for screening mammogram for malignant neoplasm of breast: Secondary | ICD-10-CM | POA: Diagnosis not present

## 2023-12-19 DIAGNOSIS — R87610 Atypical squamous cells of undetermined significance on cytologic smear of cervix (ASC-US): Secondary | ICD-10-CM | POA: Diagnosis not present

## 2023-12-19 DIAGNOSIS — Z6822 Body mass index (BMI) 22.0-22.9, adult: Secondary | ICD-10-CM | POA: Diagnosis not present

## 2023-12-25 ENCOUNTER — Encounter (HOSPITAL_BASED_OUTPATIENT_CLINIC_OR_DEPARTMENT_OTHER): Payer: Self-pay | Admitting: Pulmonary Disease

## 2023-12-25 NOTE — Telephone Encounter (Signed)
**Note De-identified  [place] Obfuscation** Please advise 

## 2023-12-26 LAB — FUNGUS CULTURE WITH STAIN

## 2023-12-26 LAB — FUNGUS CULTURE RESULT

## 2023-12-26 LAB — FUNGAL ORGANISM REFLEX

## 2023-12-28 DIAGNOSIS — J0141 Acute recurrent pansinusitis: Secondary | ICD-10-CM | POA: Diagnosis not present

## 2023-12-28 DIAGNOSIS — F419 Anxiety disorder, unspecified: Secondary | ICD-10-CM | POA: Diagnosis not present

## 2023-12-28 DIAGNOSIS — Z01818 Encounter for other preprocedural examination: Secondary | ICD-10-CM | POA: Diagnosis not present

## 2023-12-28 DIAGNOSIS — Z9622 Myringotomy tube(s) status: Secondary | ICD-10-CM | POA: Diagnosis not present

## 2023-12-28 DIAGNOSIS — Z9221 Personal history of antineoplastic chemotherapy: Secondary | ICD-10-CM | POA: Diagnosis not present

## 2023-12-28 DIAGNOSIS — C851A Unspecified b-cell lymphoma, in remission: Secondary | ICD-10-CM | POA: Diagnosis not present

## 2024-01-01 ENCOUNTER — Encounter (HOSPITAL_BASED_OUTPATIENT_CLINIC_OR_DEPARTMENT_OTHER): Payer: Self-pay | Admitting: Pulmonary Disease

## 2024-01-01 DIAGNOSIS — J189 Pneumonia, unspecified organism: Secondary | ICD-10-CM

## 2024-01-01 NOTE — Telephone Encounter (Signed)
 Chest xray order placed.

## 2024-01-01 NOTE — Telephone Encounter (Signed)
**Note De-identified  [place] Obfuscation** Please advise 

## 2024-01-08 ENCOUNTER — Telehealth: Payer: Self-pay

## 2024-01-08 ENCOUNTER — Other Ambulatory Visit: Payer: Self-pay | Admitting: Oncology

## 2024-01-08 DIAGNOSIS — H9012 Conductive hearing loss, unilateral, left ear, with unrestricted hearing on the contralateral side: Secondary | ICD-10-CM | POA: Diagnosis not present

## 2024-01-08 DIAGNOSIS — H9192 Unspecified hearing loss, left ear: Secondary | ICD-10-CM | POA: Diagnosis not present

## 2024-01-08 DIAGNOSIS — Z9221 Personal history of antineoplastic chemotherapy: Secondary | ICD-10-CM | POA: Diagnosis not present

## 2024-01-08 DIAGNOSIS — H6690 Otitis media, unspecified, unspecified ear: Secondary | ICD-10-CM | POA: Diagnosis not present

## 2024-01-08 DIAGNOSIS — Z8572 Personal history of non-Hodgkin lymphomas: Secondary | ICD-10-CM | POA: Diagnosis not present

## 2024-01-08 DIAGNOSIS — Z4582 Encounter for adjustment or removal of myringotomy device (stent) (tube): Secondary | ICD-10-CM | POA: Diagnosis not present

## 2024-01-08 DIAGNOSIS — H7012 Chronic mastoiditis, left ear: Secondary | ICD-10-CM | POA: Diagnosis not present

## 2024-01-08 DIAGNOSIS — H9212 Otorrhea, left ear: Secondary | ICD-10-CM | POA: Diagnosis not present

## 2024-01-08 DIAGNOSIS — H6532 Chronic mucoid otitis media, left ear: Secondary | ICD-10-CM | POA: Diagnosis not present

## 2024-01-08 NOTE — Telephone Encounter (Signed)
 Notified the pt and received VM. I left a messaged stating the her FMLA Forms were completed and faxed this morning. Her copy was emailed as requested.

## 2024-01-09 ENCOUNTER — Telehealth: Payer: Self-pay

## 2024-01-09 NOTE — Telephone Encounter (Signed)
 Returned  pt phone call regarding her FMLA forms being emailed. Pt let me know she hadn't received the email. Resubmitted the email while on the phone with the pt and it was successful. No other questions or concerns at this time.

## 2024-01-14 ENCOUNTER — Other Ambulatory Visit: Payer: Self-pay | Admitting: *Deleted

## 2024-01-14 ENCOUNTER — Encounter: Payer: Self-pay | Admitting: *Deleted

## 2024-01-14 ENCOUNTER — Ambulatory Visit
Admission: RE | Admit: 2024-01-14 | Discharge: 2024-01-14 | Disposition: A | Discharge status: Home / Self Care | Payer: Self-pay | Source: Ambulatory Visit | Attending: Oncology | Admitting: Oncology

## 2024-01-14 ENCOUNTER — Encounter: Payer: Self-pay | Admitting: Nurse Practitioner

## 2024-01-14 ENCOUNTER — Inpatient Hospital Stay

## 2024-01-14 ENCOUNTER — Inpatient Hospital Stay: Attending: Oncology | Admitting: Nurse Practitioner

## 2024-01-14 VITALS — BP 108/79 | HR 92 | Temp 98.4°F | Resp 18 | Ht 63.0 in | Wt 124.6 lb

## 2024-01-14 DIAGNOSIS — C8299 Follicular lymphoma, unspecified, extranodal and solid organ sites: Secondary | ICD-10-CM

## 2024-01-14 DIAGNOSIS — C9030 Solitary plasmacytoma not having achieved remission: Secondary | ICD-10-CM | POA: Diagnosis not present

## 2024-01-14 DIAGNOSIS — J189 Pneumonia, unspecified organism: Secondary | ICD-10-CM

## 2024-01-14 DIAGNOSIS — Z8572 Personal history of non-Hodgkin lymphomas: Secondary | ICD-10-CM | POA: Insufficient documentation

## 2024-01-14 DIAGNOSIS — Z9221 Personal history of antineoplastic chemotherapy: Secondary | ICD-10-CM | POA: Insufficient documentation

## 2024-01-14 LAB — CMP (CANCER CENTER ONLY)
ALT: 12 U/L (ref 0–44)
AST: 15 U/L (ref 15–41)
Albumin: 5 g/dL (ref 3.5–5.0)
Alkaline Phosphatase: 102 U/L (ref 38–126)
Anion gap: 11 (ref 5–15)
BUN: 10 mg/dL (ref 6–20)
CO2: 27 mmol/L (ref 22–32)
Calcium: 10.3 mg/dL (ref 8.9–10.3)
Chloride: 100 mmol/L (ref 98–111)
Creatinine: 0.64 mg/dL (ref 0.44–1.00)
GFR, Estimated: >60 mL/min (ref >60)
Glucose, Bld: 101 mg/dL — ABNORMAL HIGH (ref 70–99)
Potassium: 4.6 mmol/L (ref 3.5–5.1)
Sodium: 138 mmol/L (ref 135–145)
Total Bilirubin: 0.3 mg/dL (ref 0.0–1.2)
Total Protein: 8.3 g/dL — ABNORMAL HIGH (ref 6.5–8.1)

## 2024-01-14 LAB — CBC WITH DIFFERENTIAL (CANCER CENTER ONLY)
Abs Immature Granulocytes: 0.01 10*3/uL (ref 0.00–0.07)
Basophils Absolute: 0.1 10*3/uL (ref 0.0–0.1)
Basophils Relative: 1 %
Eosinophils Absolute: 0.2 10*3/uL (ref 0.0–0.5)
Eosinophils Relative: 2 %
HCT: 41.1 % (ref 36.0–46.0)
Hemoglobin: 13.5 g/dL (ref 12.0–15.0)
Immature Granulocytes: 0 %
Lymphocytes Relative: 6 %
Lymphs Abs: 0.6 10*3/uL — ABNORMAL LOW (ref 0.7–4.0)
MCH: 29.3 pg (ref 26.0–34.0)
MCHC: 32.8 g/dL (ref 30.0–36.0)
MCV: 89.2 fL (ref 80.0–100.0)
Monocytes Absolute: 0.5 10*3/uL (ref 0.1–1.0)
Monocytes Relative: 5 %
Neutro Abs: 8.4 10*3/uL — ABNORMAL HIGH (ref 1.7–7.7)
Neutrophils Relative %: 86 %
Platelet Count: 306 10*3/uL (ref 150–400)
RBC: 4.61 MIL/uL (ref 3.87–5.11)
RDW: 14.9 % (ref 11.5–15.5)
WBC Count: 9.7 10*3/uL (ref 4.0–10.5)
nRBC: 0 % (ref 0.0–0.2)

## 2024-01-14 LAB — LACTATE DEHYDROGENASE: LDH: 131 U/L (ref 98–192)

## 2024-01-14 NOTE — Progress Notes (Signed)
 PATIENT NAVIGATOR PROGRESS NOTE  Name: Adrienne Morgan Date: 01/14/2024 MRN: [id number]  DOB: 1976/06/18   Reason for visit:  New diagnosis  Comments:  Met with Ms Restivo after appt with [name] NP and Dr Truett Perna  Request faxed to [facility] pathology for slides to be sent to [facility] for Dr Laureen Ochs to review. Faxed to [phone] PET scan urgent scheduled for 01/15/24 Communicating with IR team to get bone marrow bx scheduled ASAP    Time spent counseling/coordinating care: > 60 minutes

## 2024-01-14 NOTE — Progress Notes (Signed)
 [facility] OFFICE PROGRESS NOTE   Diagnosis: Non-Hodgkin's lymphoma  INTERVAL HISTORY:   Ms. Liebig returns prior to scheduled follow-up.  She underwent a left tympanomastoidectomy 01/08/2024.  Pathology on the left mastoid contents showed plasma cell neoplasm, lambda light chain restricted, CD138 positive.  She reports a persistent cough and wheezing.  No significant shortness of breath.  No fevers or sweats.  She notes continued weight loss.  Objective:  Vital signs in last 24 hours:  Blood pressure 108/79, pulse 92, temperature 98.4 F (36.9 C), temperature source Temporal, resp. rate 18, height 5\' 3"  (1.6 m), weight 124 lb 9.6 oz (56.5 kg), SpO2 98%.    HEENT: No thrush or ulcers. Lymphatics: No palpable cervical, supraclavicular or axillary lymph nodes.  Shotty bilateral inguinal nodes. Resp: Faint wheeze left upper area lung, faint rales left lower lung.  No respiratory distress. Cardio: Regular rate and rhythm. GI: No hepatosplenomegaly. Vascular: No leg edema. Neuro: Alert and oriented. Skin: Sutures intact retroauricular left ear/mastoid region.   Lab Results:  Lab Results  Component Value Date   WBC 5.2 10/30/2023   HGB 11.9 (L) 10/30/2023   HCT 36.8 10/30/2023   MCV 92.0 10/30/2023   PLT 381 10/30/2023   NEUTROABS 4.1 10/30/2023    Imaging:  No results found.  Medications: I have reviewed the patient's current medications.  Assessment/Plan: Follicular lymphoma-low-grade, clinical stage III, FLIPI-intermediate risk Bilateral axillary ultrasound 08/29/2023-mildly prominent lymph nodes, right axillary node without a defined preserved fatty hilum Ultrasound-guided biopsy of right axillary lymph node 12/19/2022-follicular lymphoma, overall grade 1 of 3, focal grade 2, Ki-67 less than 10% with areas of under 5% and areas at 15-20%, CD20, BCL6, CD10 positive.  Bcl-2 positive.  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 PET  01/04/2023-mild to moderate FDG activity associated with borderline enlarged bilateral axillary, retroperitoneal, mesenteric, right inguinal, and right external iliac nodes, mild diffuse increased uptake in the spleen Cycle 1 Bendamustine/rituximab 01/24/2023 Cycle 2 Bendamustine/rituximab 02/21/2023, day 2 Bendamustine held secondary to fever, arthralgias, and rash Cycle 3 Bendamustine/rituximab 03/20/2023 Cycle 4 Bendamustine/rituximab 04/18/2023 PET 05/11/2023-resolution of hypermetabolic lymph nodes in the neck, chest, abdomen, and pelvis, Deauville 1, resolution of splenic hypermetabolism, no evidence of residual metabolically active lymphoma. Cycle 5 Bendamustine/rituximab 05/16/2023 Cycle 6 Bendamustine/rituximab 06/13/2023 PCOS multiple miscarriages History of a colon polyp-tubular adenoma Family history of colon and esophagus cancer, and sister with non-Hodgkin's lymphoma Upper respiratory infection beginning September 2024, multiple courses of Levaquin, treatment with prednisone, course of cefdinir, report of x-rays via primary provider's office showing lower lung infiltrates CT chest 10/16/2023: Dense consolidation in the bilateral lung base, no lymphadenopathy Course of ceftriaxone beginning 10/16/2023 x 6 doses 6.  Left ear infection-status post left myringotomy with tube placement 10/01/2023 7.  Left tympanomastoidectomy 01/08/2024-pathology on mastoid contents show involvement by plasma cell neoplasm, lambda light chain restricted 12/13/2023: CT temporal bone: Thickened left tympanic membrane with left tympanostomy tube, partial opacification of the left middle ear mastoid air cells, no definite osseous erosion, marked thinning and possible dehiscence of bony covering of left tympanic segment of left facial nerve  Disposition: Ms. Owusu appears stable.  She has a history of follicular lymphoma, completed 6 cycles of Bendamustine/rituximab.  Restaging PET scan 05/11/2023 showed no evidence of active  lymphoma.  She underwent a left tympanomastoidectomy for chronic left mastoiditis on 01/08/2024.  Pathology on mastoid contents unexpectedly showed involvement by plasma cell neoplasm.  We discussed the possibility of a solitary plasmacytoma versus multiple myeloma.  We are requesting a second opinion on the pathology.  We obtained baseline labs today to include a myeloma panel and serum light chains.  We referred her for a restaging whole-body PET scan and bone marrow biopsy.  She will return for follow-up 01/29/2024 to review results.  Patient seen with Dr. Truett Perna.    [name] ANP/GNP-BC   01/14/2024  2:12 PM  Ms. Knezevic underwent a left mastoidectomy on 01/08/2024.  Pathology from left mastoid air cell and ossicles "granulation tissue "reveals a plasma cell neoplasm.  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  This is a very unusual clinical presentation given the recent history of follicular lymphoma.  We discussed treatment with radiation if there is no evidence of a systemic plasma cell neoplasm.  She did not have a serum monoclonal protein on the evaluation for lymphoma last year.  She will return to the lab for a myeloma panel today.  She will be referred for a staging PET scan and a diagnostic bone marrow biopsy.  We will see her after the bone marrow biopsy to formulate a treatment plan.  She continues follow-up with Dr. Vassie Loll for evaluation of ongoing pneumonia symptoms.   I was present for greater than 50% of today's visit.  I performed medical decision making.  Mancel Bale, MD

## 2024-01-15 ENCOUNTER — Encounter (HOSPITAL_BASED_OUTPATIENT_CLINIC_OR_DEPARTMENT_OTHER): Payer: Self-pay | Admitting: Pulmonary Disease

## 2024-01-15 ENCOUNTER — Telehealth: Payer: Self-pay | Admitting: Nurse Practitioner

## 2024-01-15 ENCOUNTER — Encounter (HOSPITAL_COMMUNITY)
Admission: RE | Admit: 2024-01-15 | Discharge: 2024-01-15 | Disposition: A | Discharge status: Home / Self Care | Source: Ambulatory Visit | Attending: Nurse Practitioner | Admitting: Nurse Practitioner

## 2024-01-15 DIAGNOSIS — C8299 Follicular lymphoma, unspecified, extranodal and solid organ sites: Secondary | ICD-10-CM | POA: Insufficient documentation

## 2024-01-15 DIAGNOSIS — J329 Chronic sinusitis, unspecified: Secondary | ICD-10-CM | POA: Diagnosis not present

## 2024-01-15 DIAGNOSIS — C9030 Solitary plasmacytoma not having achieved remission: Secondary | ICD-10-CM | POA: Insufficient documentation

## 2024-01-15 DIAGNOSIS — J189 Pneumonia, unspecified organism: Secondary | ICD-10-CM | POA: Diagnosis not present

## 2024-01-15 DIAGNOSIS — Z8572 Personal history of non-Hodgkin lymphomas: Secondary | ICD-10-CM | POA: Diagnosis not present

## 2024-01-15 LAB — KAPPA/LAMBDA LIGHT CHAINS
Kappa free light chain: 9.8 mg/L (ref 3.3–19.4)
Kappa, lambda light chain ratio: 1.26 (ref 0.26–1.65)
Lambda free light chains: 7.8 mg/L (ref 5.7–26.3)

## 2024-01-15 LAB — GLUCOSE, CAPILLARY: Glucose-Capillary: 106 mg/dL — ABNORMAL HIGH (ref 70–99)

## 2024-01-15 LAB — BETA 2 MICROGLOBULIN, SERUM: Beta-2 Microglobulin: 1.3 mg/L (ref 0.6–2.4)

## 2024-01-15 MED ORDER — FLUDEOXYGLUCOSE F - 18 (FDG) INJECTION
6.2000 | Freq: Once | INTRAVENOUS | Status: AC
Start: 2024-01-15 — End: 2024-01-15
  Administered 2024-01-15: 6.2 via INTRAVENOUS

## 2024-01-15 NOTE — Telephone Encounter (Signed)
 I left Adrienne Morgan a message to please call the office to review recent PET scan results.

## 2024-01-15 NOTE — Telephone Encounter (Signed)
 I spoke with Adrienne Morgan to review the PET scan report-mild hypermetabolic activity within scalp soft tissue superficial to the left mastoid, likely postoperative; no hypermetabolic lesions to suggest multiple myeloma or lymphoma; resolved bilateral lower lobe pneumonia.  She will follow-up as scheduled.

## 2024-01-16 ENCOUNTER — Encounter (HOSPITAL_BASED_OUTPATIENT_CLINIC_OR_DEPARTMENT_OTHER): Payer: Self-pay | Admitting: Pulmonary Disease

## 2024-01-16 DIAGNOSIS — H7012 Chronic mastoiditis, left ear: Secondary | ICD-10-CM | POA: Diagnosis not present

## 2024-01-16 DIAGNOSIS — H9012 Conductive hearing loss, unilateral, left ear, with unrestricted hearing on the contralateral side: Secondary | ICD-10-CM | POA: Diagnosis not present

## 2024-01-16 DIAGNOSIS — C851A Unspecified b-cell lymphoma, in remission: Secondary | ICD-10-CM | POA: Diagnosis not present

## 2024-01-16 DIAGNOSIS — H9212 Otorrhea, left ear: Secondary | ICD-10-CM | POA: Diagnosis not present

## 2024-01-16 LAB — MULTIPLE MYELOMA PANEL, SERUM
Albumin SerPl Elph-Mcnc: 4 g/dL (ref 2.9–4.4)
Albumin/Glob SerPl: 1.2 (ref 0.7–1.7)
Alpha 1: 0.3 g/dL (ref 0.0–0.4)
Alpha2 Glob SerPl Elph-Mcnc: 1 g/dL (ref 0.4–1.0)
B-Globulin SerPl Elph-Mcnc: 1.1 g/dL (ref 0.7–1.3)
Gamma Glob SerPl Elph-Mcnc: 1.1 g/dL (ref 0.4–1.8)
Globulin, Total: 3.5 g/dL (ref 2.2–3.9)
IgA: 85 mg/dL — ABNORMAL LOW (ref 87–352)
IgG (Immunoglobin G), Serum: 1133 mg/dL (ref 586–1602)
IgM (Immunoglobulin M), Srm: 106 mg/dL (ref 26–217)
Total Protein ELP: 7.5 g/dL (ref 6.0–8.5)

## 2024-01-16 NOTE — Telephone Encounter (Signed)
**Note De-identified  [place] Obfuscation** Please advise 

## 2024-01-17 LAB — ACID FAST CULTURE WITH REFLEXED SENSITIVITIES (MYCOBACTERIA): Acid Fast Culture: NEGATIVE

## 2024-01-19 ENCOUNTER — Other Ambulatory Visit: Payer: Self-pay

## 2024-01-22 ENCOUNTER — Encounter: Payer: Self-pay | Admitting: *Deleted

## 2024-01-22 NOTE — Progress Notes (Signed)
 Confirmed with [facility] that tissue slides from [facility] have been received and will be forwarded to Dr. Eino Gravel to review tomorrow.

## 2024-01-23 LAB — SURGICAL PATHOLOGY

## 2024-01-24 ENCOUNTER — Encounter: Payer: Self-pay | Admitting: *Deleted

## 2024-01-24 NOTE — Progress Notes (Signed)
 PATIENT NAVIGATOR PROGRESS NOTE  Name: Adrienne Morgan Date: 01/24/2024 MRN: [id number]  DOB: 12/08/1975   Reason for visit:  Referral to [facility] and cancel bone marrow bx and upcoming appt until after she is seen at [facility]  Comments:  Cancel bone marrow bx per Dr Scherrie Curt    Time spent counseling/coordinating care: 30-45 minutes

## 2024-01-29 ENCOUNTER — Ambulatory Visit: Payer: BC Managed Care – PPO | Admitting: Oncology

## 2024-01-29 ENCOUNTER — Other Ambulatory Visit: Payer: BC Managed Care – PPO

## 2024-01-29 ENCOUNTER — Ambulatory Visit: Admitting: Radiology

## 2024-01-29 DIAGNOSIS — C8208 Follicular lymphoma grade I, lymph nodes of multiple sites: Secondary | ICD-10-CM | POA: Diagnosis not present

## 2024-01-30 DIAGNOSIS — C8208 Follicular lymphoma grade I, lymph nodes of multiple sites: Secondary | ICD-10-CM | POA: Diagnosis not present

## 2024-01-31 ENCOUNTER — Other Ambulatory Visit: Payer: Self-pay

## 2024-02-01 ENCOUNTER — Other Ambulatory Visit: Payer: Self-pay

## 2024-02-05 DIAGNOSIS — D479 Neoplasm of uncertain behavior of lymphoid, hematopoietic and related tissue, unspecified: Secondary | ICD-10-CM | POA: Diagnosis not present

## 2024-02-06 DIAGNOSIS — Z79899 Other long term (current) drug therapy: Secondary | ICD-10-CM | POA: Diagnosis not present

## 2024-02-13 DIAGNOSIS — Z0000 Encounter for general adult medical examination without abnormal findings: Secondary | ICD-10-CM | POA: Diagnosis not present

## 2024-02-13 DIAGNOSIS — Z1331 Encounter for screening for depression: Secondary | ICD-10-CM | POA: Diagnosis not present

## 2024-02-13 DIAGNOSIS — R82998 Other abnormal findings in urine: Secondary | ICD-10-CM | POA: Diagnosis not present

## 2024-02-13 DIAGNOSIS — C8299 Follicular lymphoma, unspecified, extranodal and solid organ sites: Secondary | ICD-10-CM | POA: Diagnosis not present

## 2024-02-13 DIAGNOSIS — Z1339 Encounter for screening examination for other mental health and behavioral disorders: Secondary | ICD-10-CM | POA: Diagnosis not present

## 2024-02-19 DIAGNOSIS — D472 Monoclonal gammopathy: Secondary | ICD-10-CM | POA: Diagnosis not present

## 2024-02-19 DIAGNOSIS — D479 Neoplasm of uncertain behavior of lymphoid, hematopoietic and related tissue, unspecified: Secondary | ICD-10-CM | POA: Diagnosis not present

## 2024-02-19 DIAGNOSIS — C9000 Multiple myeloma not having achieved remission: Secondary | ICD-10-CM | POA: Diagnosis not present

## 2024-02-19 DIAGNOSIS — Z79899 Other long term (current) drug therapy: Secondary | ICD-10-CM | POA: Diagnosis not present

## 2024-02-19 DIAGNOSIS — C9030 Solitary plasmacytoma not having achieved remission: Secondary | ICD-10-CM | POA: Diagnosis not present

## 2024-02-20 ENCOUNTER — Ambulatory Visit: Admitting: Nurse Practitioner

## 2024-02-22 ENCOUNTER — Other Ambulatory Visit: Payer: Self-pay

## 2024-02-26 ENCOUNTER — Other Ambulatory Visit: Payer: Self-pay

## 2024-02-28 DIAGNOSIS — Z4589 Encounter for adjustment and management of other implanted devices: Secondary | ICD-10-CM | POA: Diagnosis not present

## 2024-02-28 DIAGNOSIS — H6993 Unspecified Eustachian tube disorder, bilateral: Secondary | ICD-10-CM | POA: Diagnosis not present

## 2024-02-28 DIAGNOSIS — C9030 Solitary plasmacytoma not having achieved remission: Secondary | ICD-10-CM | POA: Diagnosis not present

## 2024-02-28 DIAGNOSIS — Z9089 Acquired absence of other organs: Secondary | ICD-10-CM | POA: Diagnosis not present

## 2024-02-29 ENCOUNTER — Other Ambulatory Visit: Payer: Self-pay | Admitting: *Deleted

## 2024-02-29 DIAGNOSIS — C9030 Solitary plasmacytoma not having achieved remission: Secondary | ICD-10-CM

## 2024-02-29 NOTE — Progress Notes (Signed)
  Referral placed with Radiation Oncology Dr Lurena Sally

## 2024-03-06 ENCOUNTER — Other Ambulatory Visit: Payer: Self-pay

## 2024-03-06 DIAGNOSIS — C8208 Follicular lymphoma grade I, lymph nodes of multiple sites: Secondary | ICD-10-CM | POA: Diagnosis not present

## 2024-03-06 DIAGNOSIS — D4989 Neoplasm of unspecified behavior of other specified sites: Secondary | ICD-10-CM | POA: Diagnosis not present

## 2024-03-07 ENCOUNTER — Telehealth: Payer: Self-pay | Admitting: Radiation Oncology

## 2024-03-07 ENCOUNTER — Other Ambulatory Visit: Payer: Self-pay

## 2024-03-07 NOTE — Telephone Encounter (Signed)
 Pt returned call after listening to VM. She advised at this time her RadOnc team @ [facility] is recommending to hold off on any XRT until after f/u with Myeloma specialist in June. She is comfortable with this decision at this time and has agreed to wait if XRT is recommended after surgery. Referral will be closed 5/30.

## 2024-03-07 NOTE — Telephone Encounter (Signed)
 Appt was cx by Adrienne Morgan with no notification sent to team. LVM for pt to make sure this appt was not needed.

## 2024-03-08 ENCOUNTER — Other Ambulatory Visit: Payer: Self-pay

## 2024-03-11 NOTE — Telephone Encounter (Signed)
 Hey! Thank you for letting me know!

## 2024-03-11 NOTE — Telephone Encounter (Signed)
 GM, Thank you for letting us  know!

## 2024-03-14 ENCOUNTER — Ambulatory Visit

## 2024-03-14 ENCOUNTER — Ambulatory Visit: Admitting: Radiation Oncology

## 2024-03-24 DIAGNOSIS — C8208 Follicular lymphoma grade I, lymph nodes of multiple sites: Secondary | ICD-10-CM | POA: Diagnosis not present

## 2024-03-24 DIAGNOSIS — E8809 Other disorders of plasma-protein metabolism, not elsewhere classified: Secondary | ICD-10-CM | POA: Diagnosis not present

## 2024-03-24 DIAGNOSIS — C9030 Solitary plasmacytoma not having achieved remission: Secondary | ICD-10-CM | POA: Diagnosis not present

## 2024-03-25 ENCOUNTER — Inpatient Hospital Stay: Attending: Oncology | Admitting: Oncology

## 2024-03-25 VITALS — BP 102/73 | HR 79 | Temp 98.2°F | Resp 18 | Ht 63.0 in | Wt 129.4 lb

## 2024-03-25 DIAGNOSIS — Z860101 Personal history of adenomatous and serrated colon polyps: Secondary | ICD-10-CM | POA: Diagnosis not present

## 2024-03-25 DIAGNOSIS — C8204 Follicular lymphoma grade I, lymph nodes of axilla and upper limb: Secondary | ICD-10-CM

## 2024-03-25 DIAGNOSIS — Z8572 Personal history of non-Hodgkin lymphomas: Secondary | ICD-10-CM | POA: Insufficient documentation

## 2024-03-25 DIAGNOSIS — E282 Polycystic ovarian syndrome: Secondary | ICD-10-CM | POA: Insufficient documentation

## 2024-03-25 NOTE — Progress Notes (Signed)
 [facility] OFFICE PROGRESS NOTE   Diagnosis: Non-Hodgkin's lymphoma  INTERVAL HISTORY:   Adrienne Morgan returns as scheduled.  She reports feeling well.  No fever or night sweats.  The cough has resolved.  She is working.  She feels fullness in the left ear.  The left ear hearing has returned to normal. She has been evaluated by the lymphoma and myeloma services at [facility].  She was seen in radiation oncology at [facility].  Dr. Verdis Glade did not recommend radiation.  She saw Dr. Dino Frank yesterday and reports she recommends a total body MRI.    Objective:  Vital signs in last 24 hours:  Blood pressure 102/73, pulse 79, temperature 98.2 F (36.8 C), temperature source Temporal, resp. rate 18, height 5' 3 (1.6 m), weight 129 lb 6.4 oz (58.7 kg), SpO2 100%.    HEENT: Dry flaking in the left external canal, no exudate Lymphatics: No cervical, supraclavicular, axillary, or inguinal nodes Resp: Lungs with bilateral end inspiratory wheeze/rhonchi, no respiratory distress Cardio: Regular rate and rhythm GI: No hepatosplenomegaly Vascular: No leg edema  Lab Results:  Lab Results  Component Value Date   WBC 9.7 01/14/2024   HGB 13.5 01/14/2024   HCT 41.1 01/14/2024   MCV 89.2 01/14/2024   PLT 306 01/14/2024   NEUTROABS 8.4 (H) 01/14/2024    CMP  Lab Results  Component Value Date   NA 138 01/14/2024   K 4.6 01/14/2024   CL 100 01/14/2024   CO2 27 01/14/2024   GLUCOSE 101 (H) 01/14/2024   BUN 10 01/14/2024   CREATININE 0.64 01/14/2024   CALCIUM  10.3 01/14/2024   PROT 8.3 (H) 01/14/2024   ALBUMIN 5.0 01/14/2024   AST 15 01/14/2024   ALT 12 01/14/2024   ALKPHOS 102 01/14/2024   BILITOT 0.3 01/14/2024   GFRNONAA >60 01/14/2024   GFRAA >90 06/15/2013      Medications: I have reviewed the patient's current medications.   Assessment/Plan: Follicular lymphoma-low-grade, clinical stage III, FLIPI-intermediate risk Bilateral axillary ultrasound 08/29/2023-mildly  prominent lymph nodes, right axillary node without a defined preserved fatty hilum Ultrasound-guided biopsy of right axillary lymph node 12/19/2022-follicular lymphoma, overall grade 1 of 3, focal grade 2, Ki-67 less than 10% with areas of under 5% and areas at 15-20%, CD20, BCL6, CD10 positive.  Bcl-2 positive.  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 PET 01/04/2023-mild to moderate FDG activity associated with borderline enlarged bilateral axillary, retroperitoneal, mesenteric, right inguinal, and right external iliac nodes, mild diffuse increased uptake in the spleen Cycle 1 Bendamustine /rituximab  01/24/2023 Cycle 2 Bendamustine /rituximab  02/21/2023, day 2 Bendamustine  held secondary to fever, arthralgias, and rash Cycle 3 Bendamustine /rituximab  03/20/2023 Cycle 4 Bendamustine /rituximab  04/18/2023 PET 05/11/2023-resolution of hypermetabolic lymph nodes in the neck, chest, abdomen, and pelvis, Deauville 1, resolution of splenic hypermetabolism, no evidence of residual metabolically active lymphoma. Cycle 5 Bendamustine /rituximab  05/16/2023 Cycle 6 Bendamustine /rituximab  06/13/2023 PCOS multiple miscarriages History of a colon polyp-tubular adenoma Family history of colon and esophagus cancer, and sister with non-Hodgkin's lymphoma Upper respiratory infection beginning September 2024, multiple courses of Levaquin, treatment with prednisone, course of cefdinir, report of x-rays via primary provider's office showing lower lung infiltrates CT chest 10/16/2023: Dense consolidation in the bilateral lung base, no lymphadenopathy Course of ceftriaxone  beginning 10/16/2023 x 6 doses 6.  Left ear infection-status post left myringotomy with tube placement 10/01/2023 7.  Left tympanomastoidectomy 01/08/2024-pathology on mastoid contents show involvement by plasma cell neoplasm, lambda light chain restricted 12/13/2023: CT temporal bone: Thickened left tympanic membrane with  left tympanostomy tube, partial  opacification of the left middle ear mastoid air cells, no definite osseous erosion, marked thinning and possible dehiscence of bony covering of left tympanic segment of left facial nerve 01/14/2024: Serum immunofixation negative, normal serum free pains 01/15/2024 PET: Mild hypermetabolism at the left mastoid scalp-postoperative, no hypermetabolic lesions to suggest myeloma or lymphoma, resolved bilateral lower lobe pneumonia 02/19/2024: Bone marrow biopsy-no morphologic evidence of plasma cell neoplasm, low cytometry negative for an abnormal plasma cell population or monoclonal B-cell population, 46XX, myeloma FISH negative for Tp53, ATM, and 11: 14     Disposition: Adrienne Morgan is in clinical remission from non-Hodgkin's lymphoma.  The restaging PET and bone marrow biopsy revealed no evidence of residual lymphoma.  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  There is no laboratory, PET, or bone marrow evidence of multiple myeloma.  She has usual clinical presentation with a history of low-grade non-Hodgkin's, and a plasma cell infiltrate at the left mastoid.  The left mastoid plasma cells are lambda light chain restricted and may represent an isolated plasmacytoma.  We discussed options of observation versus radiation to the left skull.  I will contact Dr. Dino Frank for further discussion.  Adrienne Morgan reports Dr. Dino Frank has recommended MRIs to look for evidence of myeloma. The plan is observation for now.  We will consider baseline imaging of the left skull.  Adrienne Morgan will return for an office and lab visit in 3 months.  She may have an external otitis.  She will contact Dr. Jodelle Mungo to see whether she should be prescribed left ear drops.  I will discuss her symptoms and today's physical exam findings with Dr. Jodelle Mungo.  Coni Deep, MD  03/25/2024  1:25 PM

## 2024-03-26 ENCOUNTER — Other Ambulatory Visit: Payer: Self-pay

## 2024-04-03 DIAGNOSIS — H9042 Sensorineural hearing loss, unilateral, left ear, with unrestricted hearing on the contralateral side: Secondary | ICD-10-CM | POA: Diagnosis not present

## 2024-04-03 DIAGNOSIS — C9030 Solitary plasmacytoma not having achieved remission: Secondary | ICD-10-CM | POA: Diagnosis not present

## 2024-04-03 DIAGNOSIS — Z4589 Encounter for adjustment and management of other implanted devices: Secondary | ICD-10-CM | POA: Diagnosis not present

## 2024-04-03 DIAGNOSIS — Z9622 Myringotomy tube(s) status: Secondary | ICD-10-CM | POA: Diagnosis not present

## 2024-05-16 DIAGNOSIS — C9030 Solitary plasmacytoma not having achieved remission: Secondary | ICD-10-CM | POA: Diagnosis not present

## 2024-05-30 DIAGNOSIS — Z9089 Acquired absence of other organs: Secondary | ICD-10-CM | POA: Diagnosis not present

## 2024-05-30 DIAGNOSIS — Z4589 Encounter for adjustment and management of other implanted devices: Secondary | ICD-10-CM | POA: Diagnosis not present

## 2024-05-30 DIAGNOSIS — C9030 Solitary plasmacytoma not having achieved remission: Secondary | ICD-10-CM | POA: Diagnosis not present

## 2024-05-30 DIAGNOSIS — H6993 Unspecified Eustachian tube disorder, bilateral: Secondary | ICD-10-CM | POA: Diagnosis not present

## 2024-06-18 ENCOUNTER — Other Ambulatory Visit: Payer: Self-pay

## 2024-06-25 ENCOUNTER — Inpatient Hospital Stay: Attending: Oncology

## 2024-06-25 ENCOUNTER — Inpatient Hospital Stay (HOSPITAL_BASED_OUTPATIENT_CLINIC_OR_DEPARTMENT_OTHER): Admitting: Oncology

## 2024-06-25 VITALS — BP 99/83 | HR 80 | Temp 97.9°F | Resp 15 | Wt 134.1 lb

## 2024-06-25 DIAGNOSIS — C8204 Follicular lymphoma grade I, lymph nodes of axilla and upper limb: Secondary | ICD-10-CM

## 2024-06-25 DIAGNOSIS — Z8572 Personal history of non-Hodgkin lymphomas: Secondary | ICD-10-CM | POA: Diagnosis not present

## 2024-06-25 DIAGNOSIS — C9030 Solitary plasmacytoma not having achieved remission: Secondary | ICD-10-CM

## 2024-06-25 LAB — CMP (CANCER CENTER ONLY)
ALT: 23 U/L (ref 0–44)
AST: 26 U/L (ref 15–41)
Albumin: 4.7 g/dL (ref 3.5–5.0)
Alkaline Phosphatase: 91 U/L (ref 38–126)
Anion gap: 11 (ref 5–15)
BUN: 12 mg/dL (ref 6–20)
CO2: 28 mmol/L (ref 22–32)
Calcium: 9.8 mg/dL (ref 8.9–10.3)
Chloride: 101 mmol/L (ref 98–111)
Creatinine: 0.75 mg/dL (ref 0.44–1.00)
GFR, Estimated: >60 mL/min (ref >60)
Glucose, Bld: 85 mg/dL (ref 70–99)
Potassium: 4.2 mmol/L (ref 3.5–5.1)
Sodium: 140 mmol/L (ref 135–145)
Total Bilirubin: 0.6 mg/dL (ref 0.0–1.2)
Total Protein: 7.1 g/dL (ref 6.5–8.1)

## 2024-06-25 LAB — CBC WITH DIFFERENTIAL (CANCER CENTER ONLY)
Abs Immature Granulocytes: 0 K/uL (ref 0.00–0.07)
Basophils Absolute: 0 K/uL (ref 0.0–0.1)
Basophils Relative: 1 %
Eosinophils Absolute: 0.2 K/uL (ref 0.0–0.5)
Eosinophils Relative: 5 %
HCT: 40.8 % (ref 36.0–46.0)
Hemoglobin: 13.6 g/dL (ref 12.0–15.0)
Immature Granulocytes: 0 %
Lymphocytes Relative: 21 %
Lymphs Abs: 0.7 K/uL (ref 0.7–4.0)
MCH: 31.3 pg (ref 26.0–34.0)
MCHC: 33.3 g/dL (ref 30.0–36.0)
MCV: 94 fL (ref 80.0–100.0)
Monocytes Absolute: 0.3 K/uL (ref 0.1–1.0)
Monocytes Relative: 10 %
Neutro Abs: 2.3 K/uL (ref 1.7–7.7)
Neutrophils Relative %: 63 %
Platelet Count: 196 K/uL (ref 150–400)
RBC: 4.34 MIL/uL (ref 3.87–5.11)
RDW: 12.4 % (ref 11.5–15.5)
WBC Count: 3.5 K/uL — ABNORMAL LOW (ref 4.0–10.5)
nRBC: 0 % (ref 0.0–0.2)

## 2024-06-25 LAB — LACTATE DEHYDROGENASE: LDH: 159 U/L (ref 98–192)

## 2024-06-25 NOTE — Progress Notes (Signed)
 [facility] OFFICE PROGRESS NOTE   Diagnosis: Non-Hodgkin's lymphoma, plasmacytoma  INTERVAL HISTORY:   Adrienne Morgan returns as scheduled.  She feels well.  No fever or dyspnea.  She has an occasional sweats, but no consistent night sweats.  She is working.  She saw Dr. Sherill 05/30/2024 and there was no physical exam evidence of recurrent plasmacytoma.  She underwent a total body MRI at [facility] 05/16/2024.  No lytic bone lesions were seen.  She is scheduled for a second look mastoid procedure with Dr. Sherill in early December.  Objective:  Vital signs in last 24 hours:  Blood pressure 99/83, pulse 80, temperature 97.9 F (36.6 C), temperature source Temporal, resp. rate 15, weight 134 lb 1.6 oz (60.8 kg), SpO2 100%.    HEENT: Neck without mass Lymphatics: No cervical, supraclavicular, axillary, or inguinal nodes Resp: Lungs clear bilaterally Cardio: Regular rate and rhythm GI: No hepatosplenomegaly Vascular: No leg edema   Lab Results:  Lab Results  Component Value Date   WBC 3.5 (L) 06/25/2024   HGB 13.6 06/25/2024   HCT 40.8 06/25/2024   MCV 94.0 06/25/2024   PLT 196 06/25/2024   NEUTROABS 2.3 06/25/2024    CMP  Lab Results  Component Value Date   NA 140 06/25/2024   K 4.2 06/25/2024   CL 101 06/25/2024   CO2 28 06/25/2024   GLUCOSE 85 06/25/2024   BUN 12 06/25/2024   CREATININE 0.75 06/25/2024   CALCIUM  9.8 06/25/2024   PROT 7.1 06/25/2024   ALBUMIN 4.7 06/25/2024   AST 26 06/25/2024   ALT 23 06/25/2024   ALKPHOS 91 06/25/2024   BILITOT 0.6 06/25/2024   GFRNONAA >60 06/25/2024   GFRAA >90 06/15/2013    No results found for: CEA1, CEA, CAN199, CA125  No results found for: INR, LABPROT  Imaging:  No results found.  Medications: I have reviewed the patient's current medications.   Assessment/Plan: Follicular lymphoma-low-grade, clinical stage III, FLIPI-intermediate risk Bilateral axillary ultrasound 08/29/2023-mildly  prominent lymph nodes, right axillary node without a defined preserved fatty hilum Ultrasound-guided biopsy of right axillary lymph node 12/19/2022-follicular lymphoma, overall grade 1 of 3, focal grade 2, Ki-67 less than 10% with areas of under 5% and areas at 15-20%, CD20, BCL6, CD10 positive.  Bcl-2 positive.  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 PET 01/04/2023-mild to moderate FDG activity associated with borderline enlarged bilateral axillary, retroperitoneal, mesenteric, right inguinal, and right external iliac nodes, mild diffuse increased uptake in the spleen Cycle 1 Bendamustine /rituximab  01/24/2023 Cycle 2 Bendamustine /rituximab  02/21/2023, day 2 Bendamustine  held secondary to fever, arthralgias, and rash Cycle 3 Bendamustine /rituximab  03/20/2023 Cycle 4 Bendamustine /rituximab  04/18/2023 PET 05/11/2023-resolution of hypermetabolic lymph nodes in the neck, chest, abdomen, and pelvis, Deauville 1, resolution of splenic hypermetabolism, no evidence of residual metabolically active lymphoma. Cycle 5 Bendamustine /rituximab  05/16/2023 Cycle 6 Bendamustine /rituximab  06/13/2023 PCOS multiple miscarriages History of a colon polyp-tubular adenoma Family history of colon and esophagus cancer, and sister with non-Hodgkin's lymphoma Upper respiratory infection beginning September 2024, multiple courses of Levaquin, treatment with prednisone, course of cefdinir, report of x-rays via primary provider's office showing lower lung infiltrates CT chest 10/16/2023: Dense consolidation in the bilateral lung base, no lymphadenopathy Course of ceftriaxone  beginning 10/16/2023 x 6 doses 6.  Left ear infection-status post left myringotomy with tube placement 10/01/2023 7.  Left tympanomastoidectomy 01/08/2024-pathology on mastoid contents show involvement by plasma cell neoplasm, lambda light chain restricted 12/13/2023: CT temporal bone: Thickened left tympanic membrane with left tympanostomy tube, partial  opacification of the left middle ear mastoid air cells, no definite osseous erosion, marked thinning and possible dehiscence of bony covering of left tympanic segment of left facial nerve 01/14/2024: Serum immunofixation negative, normal serum free pains 01/15/2024 PET: Mild hypermetabolism at the left mastoid scalp-postoperative, no hypermetabolic lesions to suggest myeloma or lymphoma, resolved bilateral lower lobe pneumonia 02/19/2024: Bone marrow biopsy-no morphologic evidence of plasma cell neoplasm, low cytometry negative for an abnormal plasma cell population or monoclonal B-cell population, 46XX, myeloma FISH negative for Tp53, ATM, and 11: 14 05/16/2024 MRI whole-body: Cranium without osseous lesion, left thyroid  nodule (1.8 x 2.2 x 2.4 cm)-present on prior exams and not metabolic on PET scan, small focus of diffusion restriction of the proximal left humerus without correlate on eventual MRI sequences or PET 8.  Left thyroid  nodule on MRI 05/16/2024-present on previous imaging studies      Disposition: Adrienne Morgan is in remission from non-Hodgkin's lymphoma.  There is no evidence for progression of the left mastoid plasmacytoma or development of multiple myeloma.  She is scheduled for a second look mastoid procedure by Dr. Sherill in December.  She is scheduled for follow-up at [facility] in December.  She will return for an office visit here 09/24/2024.  We reviewed the thyroid  nodule finding on the 05/16/2024 MRI.  The nodule appears to have been present on previous imaging and is not hypermetabolic.  This is most likely a benign finding.  She is comfortable with observation.  Arley Hof, MD  06/25/2024  2:57 PM

## 2024-06-26 ENCOUNTER — Other Ambulatory Visit: Payer: Self-pay

## 2024-06-26 LAB — KAPPA/LAMBDA LIGHT CHAINS
Kappa free light chain: 10.6 mg/L (ref 3.3–19.4)
Kappa, lambda light chain ratio: 1.26 (ref 0.26–1.65)
Lambda free light chains: 8.4 mg/L (ref 5.7–26.3)

## 2024-06-30 ENCOUNTER — Ambulatory Visit: Payer: Self-pay | Admitting: Oncology

## 2024-06-30 LAB — MULTIPLE MYELOMA PANEL, SERUM
Albumin SerPl Elph-Mcnc: 3.8 g/dL (ref 2.9–4.4)
Albumin/Glob SerPl: 1.6 (ref 0.7–1.7)
Alpha 1: 0.2 g/dL (ref 0.0–0.4)
Alpha2 Glob SerPl Elph-Mcnc: 0.6 g/dL (ref 0.4–1.0)
B-Globulin SerPl Elph-Mcnc: 0.8 g/dL (ref 0.7–1.3)
Gamma Glob SerPl Elph-Mcnc: 0.9 g/dL (ref 0.4–1.8)
Globulin, Total: 2.5 g/dL (ref 2.2–3.9)
IgA: 44 mg/dL — ABNORMAL LOW (ref 87–352)
IgG (Immunoglobin G), Serum: 1030 mg/dL (ref 586–1602)
IgM (Immunoglobulin M), Srm: 81 mg/dL (ref 26–217)
Total Protein ELP: 6.3 g/dL (ref 6.0–8.5)

## 2024-07-01 ENCOUNTER — Encounter: Payer: Self-pay | Admitting: Oncology

## 2024-07-01 ENCOUNTER — Encounter: Payer: Self-pay | Admitting: Nurse Practitioner

## 2024-07-01 NOTE — Telephone Encounter (Signed)
-----   Message from Arley Hof sent at 06/30/2024  5:57 PM EDT ----- Please call patient, the myeloma panel is negative, f/u as scheduled  ----- Message ----- From: Rebecka, [facility] [employer] Sent: 06/25/2024   8:46 AM EDT To: Arley KATHEE Hof, MD

## 2024-07-01 NOTE — Telephone Encounter (Signed)
 Patient gave verbal understanding and had no further questions or concerns

## 2024-08-11 LAB — PNEUMOCYSTIS JIROVECI SMEAR BY DFA

## 2024-09-17 DIAGNOSIS — Z8589 Personal history of malignant neoplasm of other organs and systems: Secondary | ICD-10-CM | POA: Diagnosis not present

## 2024-09-17 DIAGNOSIS — C9031 Solitary plasmacytoma in remission: Secondary | ICD-10-CM | POA: Diagnosis not present

## 2024-09-17 DIAGNOSIS — Z4582 Encounter for adjustment or removal of myringotomy device (stent) (tube): Secondary | ICD-10-CM | POA: Diagnosis not present

## 2024-09-17 DIAGNOSIS — C859A Non-Hodgkin lymphoma, unspecified, in remission: Secondary | ICD-10-CM | POA: Diagnosis not present

## 2024-09-17 DIAGNOSIS — C9030 Solitary plasmacytoma not having achieved remission: Secondary | ICD-10-CM | POA: Diagnosis not present

## 2024-09-17 DIAGNOSIS — Z8522 Personal history of malignant neoplasm of nasal cavities, middle ear, and accessory sinuses: Secondary | ICD-10-CM | POA: Diagnosis not present

## 2024-09-17 DIAGNOSIS — H7012 Chronic mastoiditis, left ear: Secondary | ICD-10-CM | POA: Diagnosis not present

## 2024-09-17 DIAGNOSIS — Z9889 Other specified postprocedural states: Secondary | ICD-10-CM | POA: Diagnosis not present

## 2024-09-24 ENCOUNTER — Inpatient Hospital Stay: Attending: Oncology | Admitting: Oncology

## 2024-09-24 ENCOUNTER — Other Ambulatory Visit

## 2024-09-24 VITALS — BP 102/75 | HR 84 | Temp 98.1°F | Resp 18 | Ht 63.0 in | Wt 138.6 lb

## 2024-09-24 DIAGNOSIS — Z860101 Personal history of adenomatous and serrated colon polyps: Secondary | ICD-10-CM | POA: Diagnosis not present

## 2024-09-24 DIAGNOSIS — Z800 Family history of malignant neoplasm of digestive organs: Secondary | ICD-10-CM | POA: Insufficient documentation

## 2024-09-24 DIAGNOSIS — C8204 Follicular lymphoma grade I, lymph nodes of axilla and upper limb: Secondary | ICD-10-CM | POA: Diagnosis not present

## 2024-09-24 DIAGNOSIS — C9030 Solitary plasmacytoma not having achieved remission: Secondary | ICD-10-CM

## 2024-09-24 DIAGNOSIS — E282 Polycystic ovarian syndrome: Secondary | ICD-10-CM | POA: Diagnosis not present

## 2024-09-24 DIAGNOSIS — C822A Follicular lymphoma grade III, unspecified, in remission: Secondary | ICD-10-CM | POA: Insufficient documentation

## 2024-09-24 LAB — CBC WITH DIFFERENTIAL (CANCER CENTER ONLY)
Abs Immature Granulocytes: 0.01 K/uL (ref 0.00–0.07)
Basophils Absolute: 0 K/uL (ref 0.0–0.1)
Basophils Relative: 1 %
Eosinophils Absolute: 0.2 K/uL (ref 0.0–0.5)
Eosinophils Relative: 4 %
HCT: 40.4 % (ref 36.0–46.0)
Hemoglobin: 13.9 g/dL (ref 12.0–15.0)
Immature Granulocytes: 0 %
Lymphocytes Relative: 22 %
Lymphs Abs: 1 K/uL (ref 0.7–4.0)
MCH: 31.2 pg (ref 26.0–34.0)
MCHC: 34.4 g/dL (ref 30.0–36.0)
MCV: 90.8 fL (ref 80.0–100.0)
Monocytes Absolute: 0.4 K/uL (ref 0.1–1.0)
Monocytes Relative: 10 %
Neutro Abs: 2.8 K/uL (ref 1.7–7.7)
Neutrophils Relative %: 63 %
Platelet Count: 198 K/uL (ref 150–400)
RBC: 4.45 MIL/uL (ref 3.87–5.11)
RDW: 12.2 % (ref 11.5–15.5)
WBC Count: 4.5 K/uL (ref 4.0–10.5)
nRBC: 0 % (ref 0.0–0.2)

## 2024-09-24 LAB — CMP (CANCER CENTER ONLY)
ALT: 14 U/L (ref 0–44)
AST: 18 U/L (ref 15–41)
Albumin: 4.8 g/dL (ref 3.5–5.0)
Alkaline Phosphatase: 97 U/L (ref 38–126)
Anion gap: 9 (ref 5–15)
BUN: 15 mg/dL (ref 6–20)
CO2: 29 mmol/L (ref 22–32)
Calcium: 10.6 mg/dL — ABNORMAL HIGH (ref 8.9–10.3)
Chloride: 101 mmol/L (ref 98–111)
Creatinine: 0.66 mg/dL (ref 0.44–1.00)
GFR, Estimated: >60 mL/min (ref >60)
Glucose, Bld: 89 mg/dL (ref 70–99)
Potassium: 4.1 mmol/L (ref 3.5–5.1)
Sodium: 140 mmol/L (ref 135–145)
Total Bilirubin: 0.5 mg/dL (ref 0.0–1.2)
Total Protein: 7.7 g/dL (ref 6.5–8.1)

## 2024-09-24 NOTE — Progress Notes (Signed)
 [facility] OFFICE PROGRESS NOTE   Diagnosis: Non-Hodgkin's lymphoma, possible plasma cell neoplasm  INTERVAL HISTORY:   Adrienne Morgan returns as scheduled.  She feels well.  No dyspnea.  No night sweats.  She has hot flashes.  She is working. She underwent a second look tympanomastoidectomy by Dr. Thaddeus on 09/17/2024.  She reports tolerating procedure well.  The left mastoid cavity had healthy appearing mucosa and fibro reactive disease.  The fibro reactive disease was removed.  No evidence of recurrent tumor. The pathology revealed fibrous tissue with chronic inflammation.  Negative for malignancy.   Objective:  Vital signs in last 24 hours:  Blood pressure 102/75, pulse 84, temperature 98.1 F (36.7 C), temperature source Temporal, resp. rate 18, height 5' 3 (1.6 m), weight 138 lb 9.6 oz (62.9 kg), SpO2 100%.    HEENT: Sutures posterior to the left ear Lymphatics: No cervical, supraclavicular, axillary, or inguinal nodes Resp: Lungs clear bilaterally Cardio: Regular rate and rhythm GI: No hepatosplenomegaly Vascular: No leg edema   Lab Results:  Lab Results  Component Value Date   WBC 4.5 09/24/2024   HGB 13.9 09/24/2024   HCT 40.4 09/24/2024   MCV 90.8 09/24/2024   PLT 198 09/24/2024   NEUTROABS 2.8 09/24/2024    CMP  Lab Results  Component Value Date   NA 140 09/24/2024   K 4.1 09/24/2024   CL 101 09/24/2024   CO2 29 09/24/2024   GLUCOSE 89 09/24/2024   BUN 15 09/24/2024   CREATININE 0.66 09/24/2024   CALCIUM  10.6 (H) 09/24/2024   PROT 7.7 09/24/2024   ALBUMIN 4.8 09/24/2024   AST 18 09/24/2024   ALT 14 09/24/2024   ALKPHOS 97 09/24/2024   BILITOT 0.5 09/24/2024   GFRNONAA >60 09/24/2024   GFRAA >90 06/15/2013    Medications: I have reviewed the patient's current medications.   Assessment/Plan: Follicular lymphoma-low-grade, clinical stage III, FLIPI-intermediate risk Bilateral axillary ultrasound 08/29/2023-mildly prominent lymph  nodes, right axillary node without a defined preserved fatty hilum Ultrasound-guided biopsy of right axillary lymph node 12/19/2022-follicular lymphoma, overall grade 1 of 3, focal grade 2, Ki-67 less than 10% with areas of under 5% and areas at 15-20%, CD20, BCL6, CD10 positive.  Bcl-2 positive.  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 PET 01/04/2023-mild to moderate FDG activity associated with borderline enlarged bilateral axillary, retroperitoneal, mesenteric, right inguinal, and right external iliac nodes, mild diffuse increased uptake in the spleen Cycle 1 Bendamustine /rituximab  01/24/2023 Cycle 2 Bendamustine /rituximab  02/21/2023, day 2 Bendamustine  held secondary to fever, arthralgias, and rash Cycle 3 Bendamustine /rituximab  03/20/2023 Cycle 4 Bendamustine /rituximab  04/18/2023 PET 05/11/2023-resolution of hypermetabolic lymph nodes in the neck, chest, abdomen, and pelvis, [place] 1, resolution of splenic hypermetabolism, no evidence of residual metabolically active lymphoma. Cycle 5 Bendamustine /rituximab  05/16/2023 Cycle 6 Bendamustine /rituximab  06/13/2023 PCOS multiple miscarriages History of a colon polyp-tubular adenoma Family history of colon and esophagus cancer, and sister with non-Hodgkin's lymphoma Upper respiratory infection beginning September 2024, multiple courses of Levaquin, treatment with prednisone, course of cefdinir, report of x-rays via primary provider's office showing lower lung infiltrates CT chest 10/16/2023: Dense consolidation in the bilateral lung base, no lymphadenopathy Course of ceftriaxone  beginning 10/16/2023 x 6 doses 6.  Left ear infection-status post left myringotomy with tube placement 10/01/2023 7.  Left tympanomastoidectomy 01/08/2024-pathology on mastoid contents show involvement by plasma cell neoplasm, lambda light chain restricted 12/13/2023: CT temporal bone: Thickened left tympanic membrane with left tympanostomy tube, partial opacification  of the left middle ear mastoid air  cells, no definite osseous erosion, marked thinning and possible dehiscence of bony covering of left tympanic segment of left facial nerve 01/14/2024: Serum immunofixation negative, normal serum free pains 01/15/2024 PET: Mild hypermetabolism at the left mastoid scalp-postoperative, no hypermetabolic lesions to suggest myeloma or lymphoma, resolved bilateral lower lobe pneumonia 02/19/2024: Bone marrow biopsy-no morphologic evidence of plasma cell neoplasm, low cytometry negative for an abnormal plasma cell population or monoclonal B-cell population, 46XX, myeloma FISH negative for Tp53, ATM, and 11: 14 05/16/2024 MRI whole-body: Cranium without osseous lesion, left thyroid  nodule (1.8 x 2.2 x 2.4 cm)-present on prior exams and not metabolic on PET scan, small focus of diffusion restriction of the proximal left humerus without correlate on eventual MRI sequences or PET 09/17/2024-second look tympanomastoidectomy, no evidence of recurrent tumor, pathology from the left mastoid-fibrous tissue with epithelial lining and chronic inflammation-negative for malignancy 8.  Left thyroid  nodule on MRI 05/16/2024-present on previous imaging studies       Disposition: [name] is in clinical remission from lymphoma.  There is no clinical evidence for development of multiple myeloma.  She has undergone an extensive negative diagnostic evaluation including a second look mastoidectomy without evidence of a plasma cell neoplasm.  We will follow-up on the myeloma panel from today.  The calcium  is mildly elevated today, this is likely a benign nonspecific finding.  She is scheduled for a telehealth visit with Dr. Cyndee tomorrow.  She will continue postoperative care with Dr. Thaddeus.  [name] will return for an office and lab visit in 4 months.  She will call in the interim for new symptoms.  Arley Hof, MD  09/24/2024  9:51 AM

## 2024-09-25 DIAGNOSIS — Z4589 Encounter for adjustment and management of other implanted devices: Secondary | ICD-10-CM | POA: Diagnosis not present

## 2024-09-25 DIAGNOSIS — C9030 Solitary plasmacytoma not having achieved remission: Secondary | ICD-10-CM | POA: Diagnosis not present

## 2024-09-25 DIAGNOSIS — E8809 Other disorders of plasma-protein metabolism, not elsewhere classified: Secondary | ICD-10-CM | POA: Diagnosis not present

## 2024-09-25 LAB — KAPPA/LAMBDA LIGHT CHAINS
Kappa free light chain: 8.9 mg/L (ref 3.3–19.4)
Kappa, lambda light chain ratio: 1.31 (ref 0.26–1.65)
Lambda free light chains: 6.8 mg/L (ref 5.7–26.3)

## 2024-09-26 ENCOUNTER — Other Ambulatory Visit: Payer: Self-pay

## 2024-09-26 ENCOUNTER — Ambulatory Visit: Payer: Self-pay | Admitting: Oncology

## 2024-09-26 LAB — MULTIPLE MYELOMA PANEL, SERUM
Albumin SerPl Elph-Mcnc: 3.8 g/dL (ref 2.9–4.4)
Albumin/Glob SerPl: 1.3 (ref 0.7–1.7)
Alpha 1: 0.2 g/dL (ref 0.0–0.4)
Alpha2 Glob SerPl Elph-Mcnc: 0.8 g/dL (ref 0.4–1.0)
B-Globulin SerPl Elph-Mcnc: 0.9 g/dL (ref 0.7–1.3)
Gamma Glob SerPl Elph-Mcnc: 1 g/dL (ref 0.4–1.8)
Globulin, Total: 3 g/dL (ref 2.2–3.9)
IgA: 34 mg/dL — ABNORMAL LOW (ref 87–352)
IgG (Immunoglobin G), Serum: 977 mg/dL (ref 586–1602)
IgM (Immunoglobulin M), Srm: 80 mg/dL (ref 26–217)
Total Protein ELP: 6.8 g/dL (ref 6.0–8.5)

## 2024-10-12 ENCOUNTER — Other Ambulatory Visit: Payer: Self-pay

## 2025-01-22 ENCOUNTER — Inpatient Hospital Stay: Admitting: Oncology

## 2025-01-22 ENCOUNTER — Inpatient Hospital Stay
